# Patient Record
Sex: Male | Born: 1955 | Race: Black or African American | Hispanic: No | Smoking: Never smoker
Health system: Southern US, Community
[De-identification: ages and names within clinical notes are randomized; demographics above are authoritative.]

## PROBLEM LIST (undated history)

## (undated) DIAGNOSIS — E785 Hyperlipidemia, unspecified: Secondary | ICD-10-CM

## (undated) DIAGNOSIS — I10 Essential (primary) hypertension: Secondary | ICD-10-CM

## (undated) HISTORY — DX: Hyperlipidemia, unspecified: E78.5

## (undated) HISTORY — DX: Essential (primary) hypertension: I10

---

## 2003-03-09 ENCOUNTER — Emergency Department (HOSPITAL_COMMUNITY): Admission: EM | Admit: 2003-03-09 | Discharge: 2003-03-09 | Payer: Self-pay | Admitting: Emergency Medicine

## 2003-06-18 ENCOUNTER — Encounter: Payer: Self-pay | Admitting: Internal Medicine

## 2005-04-19 ENCOUNTER — Ambulatory Visit: Payer: Self-pay | Admitting: Internal Medicine

## 2005-04-25 ENCOUNTER — Ambulatory Visit: Payer: Self-pay | Admitting: Internal Medicine

## 2005-06-16 ENCOUNTER — Ambulatory Visit: Payer: Self-pay | Admitting: Internal Medicine

## 2006-08-31 ENCOUNTER — Ambulatory Visit: Payer: Self-pay | Admitting: Internal Medicine

## 2006-08-31 DIAGNOSIS — R259 Unspecified abnormal involuntary movements: Secondary | ICD-10-CM | POA: Insufficient documentation

## 2006-10-10 ENCOUNTER — Ambulatory Visit: Payer: Self-pay | Admitting: Internal Medicine

## 2006-10-10 LAB — CONVERTED CEMR LAB
ALT: 40 units/L (ref 0–53)
AST: 22 units/L (ref 0–37)
Albumin: 4.2 g/dL (ref 3.5–5.2)
Alkaline Phosphatase: 94 units/L (ref 39–117)
BUN: 14 mg/dL (ref 6–23)
Basophils Absolute: 0 10*3/uL (ref 0.0–0.1)
Basophils Relative: 0.7 % (ref 0.0–1.0)
Bilirubin Urine: NEGATIVE
Bilirubin, Direct: 0.1 mg/dL (ref 0.0–0.3)
Blood in Urine, dipstick: NEGATIVE
CO2: 29 meq/L (ref 19–32)
Calcium: 9.6 mg/dL (ref 8.4–10.5)
Chloride: 109 meq/L (ref 96–112)
Cholesterol: 212 mg/dL (ref 0–200)
Creatinine, Ser: 1.3 mg/dL (ref 0.4–1.5)
Direct LDL: 152.2 mg/dL
Eosinophils Absolute: 0.2 10*3/uL (ref 0.0–0.6)
Eosinophils Relative: 3.3 % (ref 0.0–5.0)
GFR calc Af Amer: 75 mL/min
GFR calc non Af Amer: 62 mL/min
Glucose, Bld: 91 mg/dL (ref 70–99)
Glucose, Urine, Semiquant: NEGATIVE
HCT: 43.5 % (ref 39.0–52.0)
HDL: 44.7 mg/dL (ref >39.0)
Hemoglobin: 14.8 g/dL (ref 13.0–17.0)
Ketones, urine, test strip: NEGATIVE
Lymphocytes Relative: 33.1 % (ref 12.0–46.0)
MCHC: 33.9 g/dL (ref 30.0–36.0)
MCV: 89 fL (ref 78.0–100.0)
Monocytes Absolute: 0.5 10*3/uL (ref 0.2–0.7)
Monocytes Relative: 7.9 % (ref 3.0–11.0)
Neutro Abs: 3.5 10*3/uL (ref 1.4–7.7)
Neutrophils Relative %: 55 % (ref 43.0–77.0)
Nitrite: NEGATIVE
PSA: 0.72 ng/mL (ref 0.10–4.00)
Platelets: 310 10*3/uL (ref 150–400)
Potassium: 5.3 meq/L — ABNORMAL HIGH (ref 3.5–5.1)
Protein, U semiquant: NEGATIVE
RBC: 4.89 M/uL (ref 4.22–5.81)
RDW: 12.6 % (ref 11.5–14.6)
Sodium: 143 meq/L (ref 135–145)
Specific Gravity, Urine: 1.025
TSH: 2.39 microintl units/mL (ref 0.35–5.50)
Total Bilirubin: 0.7 mg/dL (ref 0.3–1.2)
Total CHOL/HDL Ratio: 4.7
Total Protein: 7.3 g/dL (ref 6.0–8.3)
Triglycerides: 109 mg/dL (ref 0–149)
Urobilinogen, UA: 0.2
VLDL: 22 mg/dL (ref 0–40)
WBC Urine, dipstick: NEGATIVE
WBC: 6.3 10*3/uL (ref 4.5–10.5)
pH: 5

## 2006-10-15 ENCOUNTER — Ambulatory Visit: Payer: Self-pay | Admitting: Internal Medicine

## 2006-10-15 DIAGNOSIS — F528 Other sexual dysfunction not due to a substance or known physiological condition: Secondary | ICD-10-CM

## 2006-10-15 DIAGNOSIS — E782 Mixed hyperlipidemia: Secondary | ICD-10-CM

## 2006-10-17 LAB — CONVERTED CEMR LAB
FSH: 5.6 milliintl units/mL
LH: 2.2 milliintl units/mL
Testosterone: 230 ng/dL — ABNORMAL LOW (ref 350.00–890)

## 2006-10-19 ENCOUNTER — Ambulatory Visit: Payer: Self-pay | Admitting: Internal Medicine

## 2006-10-19 DIAGNOSIS — E291 Testicular hypofunction: Secondary | ICD-10-CM

## 2008-02-13 ENCOUNTER — Ambulatory Visit: Payer: Self-pay | Admitting: Internal Medicine

## 2008-02-13 LAB — CONVERTED CEMR LAB
Albumin: 4.7 g/dL (ref 3.5–5.2)
Alkaline Phosphatase: 87 units/L (ref 39–117)
BUN: 14 mg/dL (ref 6–23)
Basophils Absolute: 0 10*3/uL (ref 0.0–0.1)
Bilirubin Urine: NEGATIVE
Cholesterol: 243 mg/dL (ref 0–200)
Direct LDL: 183.1 mg/dL
Eosinophils Absolute: 0.1 10*3/uL (ref 0.0–0.7)
Eosinophils Relative: 0.9 % (ref 0.0–5.0)
GFR calc Af Amer: 75 mL/min
GFR calc non Af Amer: 62 mL/min
Glucose, Urine, Semiquant: NEGATIVE
HCT: 47 % (ref 39.0–52.0)
Ketones, urine, test strip: NEGATIVE
MCHC: 34.1 g/dL (ref 30.0–36.0)
MCV: 90.8 fL (ref 78.0–100.0)
Monocytes Absolute: 0.4 10*3/uL (ref 0.1–1.0)
Platelets: 323 10*3/uL (ref 150–400)
Potassium: 4.5 meq/L (ref 3.5–5.1)
RDW: 12.3 % (ref 11.5–14.6)
Specific Gravity, Urine: 1.025
TSH: 2.01 microintl units/mL (ref 0.35–5.50)
Total Bilirubin: 1 mg/dL (ref 0.3–1.2)
Triglycerides: 100 mg/dL (ref 0–149)
WBC: 6 10*3/uL (ref 4.5–10.5)
pH: 5.5

## 2008-02-19 ENCOUNTER — Ambulatory Visit: Payer: Self-pay | Admitting: Internal Medicine

## 2008-03-03 ENCOUNTER — Ambulatory Visit: Payer: Self-pay | Admitting: Internal Medicine

## 2008-04-02 ENCOUNTER — Ambulatory Visit: Payer: Self-pay | Admitting: Internal Medicine

## 2008-12-24 ENCOUNTER — Telehealth: Payer: Self-pay | Admitting: Internal Medicine

## 2009-02-16 ENCOUNTER — Ambulatory Visit: Payer: Self-pay | Admitting: Internal Medicine

## 2009-02-16 DIAGNOSIS — R319 Hematuria, unspecified: Secondary | ICD-10-CM

## 2009-02-16 LAB — CONVERTED CEMR LAB
Glucose, Urine, Semiquant: NEGATIVE
Specific Gravity, Urine: 1.02
WBC Urine, dipstick: NEGATIVE
pH: 5

## 2009-02-19 ENCOUNTER — Ambulatory Visit: Payer: Self-pay | Admitting: Internal Medicine

## 2009-02-20 ENCOUNTER — Encounter: Payer: Self-pay | Admitting: Internal Medicine

## 2009-02-24 ENCOUNTER — Telehealth: Payer: Self-pay | Admitting: Internal Medicine

## 2009-02-26 ENCOUNTER — Telehealth: Payer: Self-pay | Admitting: Internal Medicine

## 2009-03-03 ENCOUNTER — Telehealth: Payer: Self-pay | Admitting: Internal Medicine

## 2010-01-07 ENCOUNTER — Ambulatory Visit
Admission: RE | Admit: 2010-01-07 | Discharge: 2010-01-07 | Payer: Self-pay | Source: Home / Self Care | Attending: Internal Medicine | Admitting: Internal Medicine

## 2010-01-07 DIAGNOSIS — R109 Unspecified abdominal pain: Secondary | ICD-10-CM | POA: Insufficient documentation

## 2010-01-24 ENCOUNTER — Telehealth: Payer: Self-pay | Admitting: Internal Medicine

## 2010-02-01 NOTE — Progress Notes (Signed)
Summary: CT  Phone Note Call from Patient   Caller: Patient Call For: Birdie Sons MD Summary of Call: his CT is scheduled for 3/14. Initial call taken by: Raechel Ache, RN,  March 03, 2009 9:33 AM

## 2010-02-01 NOTE — Progress Notes (Signed)
Summary: hematuria  Phone Note Call from Patient   Caller: Patient Call For: Birdie Sons MD Summary of Call: [phone] Pt wants to know if he should notifiy [place] of anymore hematuria or what to do if it happens again?  Initial call taken by: Lynann Beaver CMA,  February 26, 2009 8:47 AM  Follow-up for Phone Call        urine culture is negative.  I would like to further investigate hematuria.  Schedule CT scan of abdomen and pelvis for hematuria.  This is with contrast. Follow-up by: Birdie Sons MD,  March 01, 2009 6:57 AM  Additional Follow-up for Phone Call Additional follow up Details #1::        Spoke to pt about scheduling the CT of abdomen and  pelvis.   Informed us that he is now having some blood in his semen.  Dr. Cato Mulligan notified, and will proceed with CT of abdomen and pelvis. Additional Follow-up by: Lynann Beaver CMA,  March 01, 2009 9:20 AM    +

## 2010-02-01 NOTE — Assessment & Plan Note (Signed)
Summary: uti, hematuria/dm   Vital Signs:  Patient profile:   55 year old male Temp:     98.2 degrees F Pulse rate:   84 / minute Resp:     12 per minute BP sitting:   118 / 72  (left arm)  Vitals Entered By: Gladis Riffle, RN (February 16, 2009 10:53 AM) CC: c/o hematuria since last night Is Patient Diabetic? No   CC:  c/o hematuria since last night.  History of Present Illness: painless hematuria noted last night--bright red blood no pain noted brown urine this a.m. small blood clot this a.m. No sxs since then no hx of kidney stone no dysuria  All other systems reviewed and were negative   Preventive Screening-Counseling & Management  Alcohol-Tobacco     Smoking Status: never  Medications Prior to Update: 1)  No Medications  Allergies (verified): No Known Drug Allergies  Physical Exam  General:  Well-developed,well-nourished,in no acute distress; alert,appropriate and cooperative throughout examination Head:  normocephalic and atraumatic.   Eyes:  pupils equal and pupils round.   Ears:  R ear normal and L ear normal.   Neck:  No deformities, masses, or tenderness noted. Chest Wall:  No deformities, masses, tenderness or gynecomastia noted. Lungs:  Normal respiratory effort, chest expands symmetrically. Lungs are clear to auscultation, no crackles or wheezes. Abdomen:  Bowel sounds positive,abdomen soft and non-tender without masses, organomegaly or hernias noted. Msk:  No deformity or scoliosis noted of thoracic or lumbar spine.     Impression & Recommendations:  Problem # 1:  HEMATURIA (ICD-599.70) ? cause U/A i am going to write vicodin just in case he develops renal colic  may need CT may need urology discussed with pt  Complete Medication List: 1)  No Medications  2)  Hydrocodone-acetaminophen 5-325 Mg Tabs (Hydrocodone-acetaminophen) .Marland Kitchen.. 1 by mouth up to 4 times per day as needed for pain  Other Orders: UA Dipstick w/o Micro (automated)   (81003) Prescriptions: HYDROCODONE-ACETAMINOPHEN 5-325 MG TABS (HYDROCODONE-ACETAMINOPHEN) 1 by mouth up to 4 times per day as needed for pain  #20 x 0   Entered and Authorized by:   Birdie Sons MD   Signed by:   Birdie Sons MD on 02/16/2009   Method used:   Print then Give to Patient   RxID:   [id number]   Laboratory Results   Urine Tests    Routine Urinalysis   Color: yellow Appearance: Clear Glucose: negative   (Normal Range: Negative) Bilirubin: negative   (Normal Range: Negative) Ketone: negative   (Normal Range: Negative) Spec. Gravity: 1.020   (Normal Range: 1.003-1.035) Blood: 2+   (Normal Range: Negative) pH: 5.0   (Normal Range: 5.0-8.0) Protein: negative   (Normal Range: Negative) Urobilinogen: 0.2   (Normal Range: 0-1) Nitrite: negative   (Normal Range: Negative) Leukocyte Esterace: negative   (Normal Range: Negative)    Comments: Rita Ohara  February 16, 2009 11:40 AM     Appended Document: uti, hematuria/dm call patient. my mistake urine culture was not done. can he give urine at any [place] office? send for culture---hematuria  Appended Document: uti, hematuria/dm Patient notified. appt made for culture 2/18.

## 2010-02-01 NOTE — Progress Notes (Signed)
Summary: test results  Phone Note Call from Patient   Caller: Patient Call For: Birdie Sons MD Summary of Call: called about lab results Initial call taken by: Raechel Ache, RN,  February 24, 2009 1:45 PM  Follow-up for Phone Call        advised urine cx negative. He is doing ok. Follow-up by: Raechel Ache, RN,  February 24, 2009 1:45 PM

## 2010-02-03 NOTE — Assessment & Plan Note (Signed)
Summary: abdominal pain/dm   Vital Signs:  Patient profile:   55 year old male Weight:      183 pounds BMI:     27.93 Temp:     98.6 degrees F oral Pulse rate:   108 / minute Pulse rhythm:   regular BP sitting:   124 / 90  (left arm) Cuff size:   regular  Vitals Entered By: Alfred Levins, CMA (January 07, 2010 11:26 AM) CC: LLQ pain x1 1/2 wks   CC:  LLQ pain x1 1/2 wks.  History of Present Illness: 4-5 days ago had significant LLQ pain. thought he was constipated---tried OTC laxatives---"cleaned me out". Sxs came back with severe pain.  Hx of kidney stones---"feels different"  Current Medications (verified): 1)  No Medications  Allergies (verified): No Known Drug Allergies  Physical Exam  General:   well-developed well-nourished male in no acute distress. HEENT exam atraumatic, or [place] muscles are intact. No icterus. Neck is supple. Chest clear to auscultation cardiac exam S1-S2 are regular abdominal exam bowel sounds, soft, nondistended. He has tenderness to palpation in the left lower quadrant no  rebound or guarding tenderness.   Impression & Recommendations:  Problem # 1:  ABDOMINAL PAIN (ICD-789.00)   the patient has diverticulitis. Discussed with him. We'll try antibiotics. He'll call if he gets fever or worsening symptoms. He does need colonoscopy. I will reschedule that. side effects of antibiotics discussed.  Orders: UA Dipstick w/o Micro (automated)  (81003)  Complete Medication List: 1)  Cipro 500 Mg Tabs (Ciprofloxacin hcl) .Marland Kitchen.. 1 by mouth 2 times daily 2)  Metronidazole 500 Mg Tabs (Metronidazole) .... Take 1 tablet by mouth three times a day  Patient Instructions: 1)  . Prescriptions: METRONIDAZOLE 500 MG  TABS (METRONIDAZOLE) Take 1 tablet by mouth three times a day  #15 x 0   Entered and Authorized by:   Birdie Sons MD   Signed by:   Birdie Sons MD on 01/07/2010   Method used:   Electronically to        [facility] [phone]*  (retail)       [address] [place], [place]  [address]       Ph: [phone]       Fax: [phone]   RxID:   [id number] CIPRO 500 MG TABS (CIPROFLOXACIN HCL) 1 by mouth 2 times daily  #14 x 0   Entered and Authorized by:   Birdie Sons MD   Signed by:   Birdie Sons MD on 01/07/2010   Method used:   Electronically to        [facility] [phone]* (retail)       [address] [place], [place]  [address]       Ph: [phone]       Fax: [phone]   RxID:   [id number]    Orders Added: 1)  Est. Patient Level IV [35573] 2)  UA Dipstick w/o Micro (automated)  [81003]  Appended Document: abdominal pain/dm    Nurse Visit   Allergies: No Known Drug Allergies Laboratory Results   Urine Tests  Date/Time Received: January 07, 2010 11:54 AM  Routine Urinalysis   Color: orange Appearance: Clear Glucose: negative   (Normal Range: Negative) Bilirubin: small   (Normal Range: Negative) Ketone: trace (5)   (Normal Range: Negative) Spec. Gravity: 1.025   (Normal Range: 1.003-1.035) Blood:  large   (Normal Range: Negative) pH: 5.5   (Normal Range: 5.0-8.0) Protein: 100   (Normal Range: Negative) Urobilinogen: 0.2   (Normal Range: 0-1) Nitrite: positive   (Normal Range: Negative) Leukocyte Esterace: negative   (Normal Range: Negative)    Comments: Alfred Levins, CMA  January 07, 2010 11:57 AM

## 2010-02-03 NOTE — Progress Notes (Signed)
  Phone Note Call from Patient   Caller: Patient Call For: Birdie Sons MD Summary of Call: Pt still has a small amount of pain before a bowel movement.  Is that normal after a bout of diverticulitis? Initial call taken by: [facility] CMA AAMA,  January 24, 2010 10:26 AM  Follow-up for Phone Call         it's possible that should resolve over a 3 to four-week period of time. If symptoms persist he may need a CT scan of the abdomen and pelvis. Follow-up by: Birdie Sons MD,  January 24, 2010 4:37 PM  Additional Follow-up for Phone Call Additional follow up Details #1::        Pt. notified. Additional Follow-up by: Lynann Beaver CMA AAMA,  January 25, 2010 8:05 AM

## 2011-07-18 ENCOUNTER — Other Ambulatory Visit (INDEPENDENT_AMBULATORY_CARE_PROVIDER_SITE_OTHER): Payer: Managed Care, Other (non HMO)

## 2011-07-18 DIAGNOSIS — Z0000 Encounter for general adult medical examination without abnormal findings: Secondary | ICD-10-CM

## 2011-07-18 LAB — LIPID PANEL: HDL: 50.4 mg/dL (ref >39.00)

## 2011-07-18 LAB — HEPATIC FUNCTION PANEL
ALT: 28 U/L (ref 0–53)
Alkaline Phosphatase: 92 U/L (ref 39–117)
Bilirubin, Direct: 0.1 mg/dL (ref 0.0–0.3)
Total Protein: 7.8 g/dL (ref 6.0–8.3)

## 2011-07-18 LAB — CBC WITH DIFFERENTIAL/PLATELET
Basophils Relative: 0.6 % (ref 0.0–3.0)
Eosinophils Absolute: 0.1 10*3/uL (ref 0.0–0.7)
Eosinophils Relative: 0.8 % (ref 0.0–5.0)
Lymphocytes Relative: 28.9 % (ref 12.0–46.0)
Neutrophils Relative %: 62.6 % (ref 43.0–77.0)
RBC: 4.98 Mil/uL (ref 4.22–5.81)
WBC: 6.8 10*3/uL (ref 4.5–10.5)

## 2011-07-18 LAB — POCT URINALYSIS DIPSTICK
Ketones, UA: NEGATIVE
Leukocytes, UA: NEGATIVE
Nitrite, UA: NEGATIVE

## 2011-07-18 LAB — BASIC METABOLIC PANEL
Calcium: 9.4 mg/dL (ref 8.4–10.5)
Creatinine, Ser: 1.2 mg/dL (ref 0.4–1.5)
GFR: 81.01 mL/min (ref >60.00)

## 2011-07-18 LAB — PSA: PSA: 0.8 ng/mL (ref 0.10–4.00)

## 2011-07-25 ENCOUNTER — Encounter: Payer: Self-pay | Admitting: Internal Medicine

## 2011-07-25 ENCOUNTER — Ambulatory Visit (INDEPENDENT_AMBULATORY_CARE_PROVIDER_SITE_OTHER): Payer: Managed Care, Other (non HMO) | Admitting: Internal Medicine

## 2011-07-25 VITALS — BP 144/94 | HR 76 | Temp 98.3°F | Ht 68.0 in | Wt 208.0 lb

## 2011-07-25 DIAGNOSIS — Z0000 Encounter for general adult medical examination without abnormal findings: Secondary | ICD-10-CM

## 2011-07-25 NOTE — Progress Notes (Signed)
Patient ID: Shane Estes, male   DOB: 1955/12/08, 56 y.o.   MRN: [id number] CPX  Past Medical History  Diagnosis Date  . Hyperlipidemia     History   Social History  . Marital Status: Married    Spouse Name: N/A    Number of Children: N/A  . Years of Education: N/A   Occupational History  . Not on file.   Social History Main Topics  . Smoking status: Never Smoker   . Smokeless tobacco: Not on file  . Alcohol Use: 6.0 oz/week    10 Glasses of wine per week  . Drug Use: No  . Sexually Active: Not on file   Other Topics Concern  . Not on file   Social History Narrative   Divorced 2011    History reviewed. No pertinent past surgical history.  Family History  Problem Relation Age of Onset  . Heart attack Mother   . Heart failure Mother   . Cancer Father     bone    No Known Allergies  No current outpatient prescriptions on file prior to visit.     patient denies chest pain, shortness of breath, orthopnea. Denies lower extremity edema, abdominal pain, change in appetite, change in bowel movements. Patient denies rashes, musculoskeletal complaints. No other specific complaints in a complete review of systems.   BP 150/104  Pulse 76  Temp 98.3 F (36.8 C) (Oral)  Ht 5\' 8"  (1.727 m)  Wt 208 lb (94.348 kg)  BMI 31.63 kg/m2 Well-developed male in no acute distress. HEENT exam atraumatic, normocephalic, extraocular muscles are intact. Conjunctivae are pink without exudate. Neck is supple without lymphadenopathy, thyromegaly, jugular venous distention. Chest is clear to auscultation without increased work of breathing. Cardiac exam S1-S2 are regular. The PMI is normal. No significant murmurs or gallops. Abdominal exam active bowel sounds, soft, nontender. No abdominal bruits. Extremities no clubbing cyanosis or edema. Peripheral pulses are normal without bruits. Neurologic exam alert and oriented without any motor or sensory deficits.   Well visit: health maint UTD

## 2011-07-26 ENCOUNTER — Encounter: Payer: Self-pay | Admitting: Internal Medicine

## 2011-08-02 ENCOUNTER — Ambulatory Visit (AMBULATORY_SURGERY_CENTER): Payer: Managed Care, Other (non HMO)

## 2011-08-02 VITALS — Ht 68.0 in | Wt 201.0 lb

## 2011-08-02 DIAGNOSIS — Z1211 Encounter for screening for malignant neoplasm of colon: Secondary | ICD-10-CM

## 2011-08-02 MED ORDER — MOVIPREP 100 G PO SOLR: 1.0000 | Freq: Once | ORAL | Status: DC

## 2011-08-16 ENCOUNTER — Ambulatory Visit (AMBULATORY_SURGERY_CENTER): Payer: Managed Care, Other (non HMO) | Admitting: Internal Medicine

## 2011-08-16 ENCOUNTER — Encounter: Payer: Self-pay | Admitting: Internal Medicine

## 2011-08-16 VITALS — BP 153/99 | HR 110 | Temp 97.1°F | Resp 20 | Ht 68.0 in | Wt 201.0 lb

## 2011-08-16 DIAGNOSIS — Z1211 Encounter for screening for malignant neoplasm of colon: Secondary | ICD-10-CM

## 2011-08-16 DIAGNOSIS — D126 Benign neoplasm of colon, unspecified: Secondary | ICD-10-CM

## 2011-08-16 MED ORDER — SODIUM CHLORIDE 0.9 % IV SOLN: 500.0000 mL | INTRAVENOUS | Status: DC

## 2011-08-16 NOTE — Op Note (Signed)
[facility] [address] [place], [place]  [address]  COLONOSCOPY PROCEDURE REPORT  PATIENT:  Shane, Estes  MR#:  [id number] BIRTHDATE:  1955/10/24, 56 yrs. old  GENDER:  male ENDOSCOPIST:  Wilhemina Bonito. Eda Keys, MD REF. BY:  Birdie Sons, M.D. PROCEDURE DATE:  08/16/2011 PROCEDURE:  Colonoscopy with snare polypectomy x 2 ASA CLASS:  Class I INDICATIONS:  Routine Risk Screening MEDICATIONS:   MAC sedation, administered by CRNA, propofol (Diprivan) 320 mg IV  DESCRIPTION OF PROCEDURE:   After the risks benefits and alternatives of the procedure were thoroughly explained, informed consent was obtained.  Digital rectal exam was performed and revealed no abnormalities.   The LB [id number] endoscope was introduced through the anus and advanced to the cecum, which was identified by both the appendix and ileocecal valve, without limitations.  The quality of the prep was excellent, using MoviPrep.  The instrument was then slowly withdrawn as the colon was fully examined. <<PROCEDUREIMAGES>>  FINDINGS:  Two polyps were found in the ascending (2mm) and transverse (3mm) colon. Polyps were snared without cautery. Retrieval was successful.   Moderate diverticulosis was found throughout the colon.   Retroflexed views in the rectum revealed no abnormalities.    The time to cecum =   2:31  minutes. The scope was then withdrawn in   13:17  minutes from the cecum and the procedure completed.  COMPLICATIONS:  None  ENDOSCOPIC IMPRESSION: 1) Two small polyps in the  colon - removed 2) Moderate diverticulosis throughout the colon  RECOMMENDATIONS: 1) Repeat colonoscopy in 5 years if polyp adenomatous; otherwise 10 years  ______________________________ Wilhemina Bonito. Eda Keys, MD  CC:  Lindley Magnus, MD;  The Patient  n. eSIGNED:   Wilhemina Bonito. Eda Keys at 08/16/2011 11:10 AM  Ted Mcalpine, [phone]

## 2011-08-16 NOTE — Progress Notes (Signed)
Patient did not experience any of the following events: a burn prior to discharge; a fall within the facility; wrong site/side/patient/procedure/implant event; or a hospital transfer or hospital admission upon discharge from the facility. (G8907) Patient did not have preoperative order for IV antibiotic SSI prophylaxis. (G8918)  

## 2011-08-16 NOTE — Patient Instructions (Signed)
YOU HAD AN ENDOSCOPIC PROCEDURE TODAY AT THE [facility]: Refer to the procedure report that was given to you for any specific questions about what was found during the examination.  If the procedure report does not answer your questions, please call your gastroenterologist to clarify.  If you requested that your care partner not be given the details of your procedure findings, then the procedure report has been included in a sealed envelope for you to review at your convenience later.  YOU SHOULD EXPECT: Some feelings of bloating in the abdomen. Passage of more gas than usual.  Walking can help get rid of the air that was put into your GI tract during the procedure and reduce the bloating. If you had a lower endoscopy (such as a colonoscopy or flexible sigmoidoscopy) you may notice spotting of blood in your stool or on the toilet paper. If you underwent a bowel prep for your procedure, then you may not have a normal bowel movement for a few days.  DIET: Your first meal following the procedure should be a light meal and then it is ok to progress to your normal diet.  A half-sandwich or bowl of soup is an example of a good first meal.  Heavy or fried foods are harder to digest and may make you feel nauseous or bloated.  Likewise meals heavy in dairy and vegetables can cause extra gas to form and this can also increase the bloating.  Drink plenty of fluids but you should avoid alcoholic beverages for 24 hours.  ACTIVITY: Your care partner should take you home directly after the procedure.  You should plan to take it easy, moving slowly for the rest of the day.  You can resume normal activity the day after the procedure however you should NOT DRIVE or use heavy machinery for 24 hours (because of the sedation medicines used during the test).    SYMPTOMS TO REPORT IMMEDIATELY: A gastroenterologist can be reached at any hour.  During normal business hours, 8:30 AM to 5:00 PM Monday through Friday,  call [phone].  After hours and on weekends, please call the GI answering service at [phone] who will take a message and have the physician on call contact you.   Following lower endoscopy (colonoscopy or flexible sigmoidoscopy):  Excessive amounts of blood in the stool  Significant tenderness or worsening of abdominal pains  Swelling of the abdomen that is new, acute  Fever of 100F or higher    FOLLOW UP: If any biopsies were taken you will be contacted by phone or by letter within the next 1-3 weeks.  Call your gastroenterologist if you have not heard about the biopsies in 3 weeks.  Our staff will call the home number listed on your records the next business day following your procedure to check on you and address any questions or concerns that you may have at that time regarding the information given to you following your procedure. This is a courtesy call and so if there is no answer at the home number and we have not heard from you through the emergency physician on call, we will assume that you have returned to your regular daily activities without incident.  SIGNATURES/CONFIDENTIALITY: You and/or your care partner have signed paperwork which will be entered into your electronic medical record.  These signatures attest to the fact that that the information above on your After Visit Summary has been reviewed and is understood.  Full responsibility of the confidentiality   of this discharge information lies with you and/or your care-partner.     INFORMATION ON POLYPS, DIVERTICULOSIS, & HIGH FIBER DIET GIVEN TO YOU TODAY 

## 2011-08-17 ENCOUNTER — Telehealth: Payer: Self-pay

## 2011-08-17 NOTE — Telephone Encounter (Signed)
I left a message on the pt's answering machine [phone] for the pt to call if any questions or concerns. maw

## 2011-08-22 ENCOUNTER — Encounter: Payer: Self-pay | Admitting: Internal Medicine

## 2011-10-25 ENCOUNTER — Ambulatory Visit (INDEPENDENT_AMBULATORY_CARE_PROVIDER_SITE_OTHER): Payer: Managed Care, Other (non HMO) | Admitting: Internal Medicine

## 2011-10-25 ENCOUNTER — Encounter: Payer: Self-pay | Admitting: Internal Medicine

## 2011-10-25 VITALS — BP 156/94 | HR 76 | Temp 98.0°F | Wt 208.0 lb

## 2011-10-25 DIAGNOSIS — I10 Essential (primary) hypertension: Secondary | ICD-10-CM

## 2011-10-25 MED ORDER — LISINOPRIL 20 MG PO TABS: 20.0000 mg | ORAL_TABLET | Freq: Every day | ORAL | Status: DC

## 2011-10-25 NOTE — Patient Instructions (Signed)
Goal blood pressure less than 135/85

## 2011-10-25 NOTE — Progress Notes (Signed)
Patient ID: Shane Estes, male   DOB: 09-19-1955, 56 y.o.   MRN: [id number] Here for bp check- he has bp measured at work-"it's not perfect, but nothing to be alarmed about"- he can't remember any numbers.  Currently on no meds for bp, lipids  He has started an exercise program  Past Medical History  Diagnosis Date  . Hyperlipidemia     History   Social History  . Marital Status: Married    Spouse Name: N/A    Number of Children: N/A  . Years of Education: N/A   Occupational History  . Not on file.   Social History Main Topics  . Smoking status: Never Smoker   . Smokeless tobacco: Never Used  . Alcohol Use: 6.0 oz/week    10 Glasses of wine per week  . Drug Use: No  . Sexually Active: Not on file   Other Topics Concern  . Not on file   Social History Narrative   Divorced 2011    No past surgical history on file.  Family History  Problem Relation Age of Onset  . Heart attack Mother   . Heart failure Mother   . Cancer Father     bone  . Colon cancer Neg Hx   . Rectal cancer Neg Hx   . Stomach cancer Neg Hx     No Known Allergies  No current outpatient prescriptions on file prior to visit.     patient denies chest pain, shortness of breath, orthopnea. Denies lower extremity edema, abdominal pain, change in appetite, change in bowel movements. Patient denies rashes, musculoskeletal complaints. No other specific complaints in a complete review of systems.   BP 156/94  Pulse 76  Temp 98 F (36.7 C) (Oral)  Wt 208 lb (94.348 kg)  well-developed well-nourished male in no acute distress. HEENT exam atraumatic, normocephalic, neck supple without jugular venous distention. Chest clear to auscultation cardiac exam S1-S2 are regular. Abdominal exam overweight with bowel sounds, soft and nontender. Extremities no edema. Neurologic exam is alert with a normal gait.

## 2011-10-25 NOTE — Assessment & Plan Note (Signed)
Reviewed previous bps- note weight gain past 2 years Start lisinopril Side efects discussed Check bp in 1 months  Discussed other risk factors for vascular disease-- lipids, age

## 2011-11-29 ENCOUNTER — Encounter: Payer: Managed Care, Other (non HMO) | Admitting: Internal Medicine

## 2011-12-11 ENCOUNTER — Encounter: Payer: Managed Care, Other (non HMO) | Admitting: Internal Medicine

## 2012-02-26 ENCOUNTER — Encounter: Payer: Managed Care, Other (non HMO) | Admitting: Internal Medicine

## 2012-02-26 NOTE — Progress Notes (Signed)
Patient ID: Shane Estes, male   DOB: [birth year], 57 y.o.   MRN: [id number] Pt is here for f/u of htn, lipids, testosterone deficiency. Tolerating meds (lisinopril)  Past Medical History  Diagnosis Date  . Hyperlipidemia     History   Social History  . Marital Status: Divorced    Spouse Name: N/A    Number of Children: N/A  . Years of Education: N/A   Occupational History  . Not on file.   Social History Main Topics  . Smoking status: Never Smoker   . Smokeless tobacco: Never Used  . Alcohol Use: 6.0 oz/week    10 Glasses of wine per week  . Drug Use: No  . Sexually Active: Not on file   Other Topics Concern  . Not on file   Social History Narrative   Divorced 2011    No past surgical history on file.  Family History  Problem Relation Age of Onset  . Heart attack Mother   . Heart failure Mother   . Cancer Father     bone  . Colon cancer Neg Hx   . Rectal cancer Neg Hx   . Stomach cancer Neg Hx     No Known Allergies  Current Outpatient Prescriptions on File Prior to Visit  Medication Sig Dispense Refill  . lisinopril (PRINIVIL,ZESTRIL) 20 MG tablet Take 1 tablet (20 mg total) by mouth daily.  90 tablet  3   No current facility-administered medications on file prior to visit.     patient denies chest pain, shortness of breath, orthopnea. Denies lower extremity edema, abdominal pain, change in appetite, change in bowel movements. Patient denies rashes, musculoskeletal complaints. No other specific complaints in a complete review of systems.   There were no vitals taken for this visit.  well-developed well-nourished male in no acute distress. HEENT exam atraumatic, normocephalic, neck supple without jugular venous distention. Chest clear to auscultation cardiac exam S1-S2 are regular. Abdominal exam overweight with bowel sounds, soft and nontender. Extremities no edema. Neurologic exam is alert with a normal gait.  This encounter was created in error -  please disregard.

## 2012-11-07 ENCOUNTER — Other Ambulatory Visit: Payer: Self-pay

## 2013-01-15 ENCOUNTER — Other Ambulatory Visit (INDEPENDENT_AMBULATORY_CARE_PROVIDER_SITE_OTHER): Payer: Managed Care, Other (non HMO)

## 2013-01-15 ENCOUNTER — Other Ambulatory Visit: Payer: Managed Care, Other (non HMO)

## 2013-01-15 DIAGNOSIS — Z0000 Encounter for general adult medical examination without abnormal findings: Secondary | ICD-10-CM

## 2013-01-15 LAB — POCT URINALYSIS DIPSTICK
BILIRUBIN UA: NEGATIVE
Glucose, UA: NEGATIVE
Ketones, UA: NEGATIVE
Leukocytes, UA: NEGATIVE
NITRITE UA: NEGATIVE
PH UA: 6
PROTEIN UA: NEGATIVE
Spec Grav, UA: <=1.005
Urobilinogen, UA: 0.2

## 2013-01-15 LAB — CBC WITH DIFFERENTIAL/PLATELET
BASOS PCT: 0.5 % (ref 0.0–3.0)
Basophils Absolute: 0 10*3/uL (ref 0.0–0.1)
EOS PCT: 1.8 % (ref 0.0–5.0)
Eosinophils Absolute: 0.1 10*3/uL (ref 0.0–0.7)
HEMATOCRIT: 46.4 % (ref 39.0–52.0)
Hemoglobin: 15.8 g/dL (ref 13.0–17.0)
LYMPHS ABS: 2.1 10*3/uL (ref 0.7–4.0)
Lymphocytes Relative: 31.2 % (ref 12.0–46.0)
MCHC: 34.1 g/dL (ref 30.0–36.0)
MCV: 87.8 fl (ref 78.0–100.0)
MONO ABS: 0.5 10*3/uL (ref 0.1–1.0)
Monocytes Relative: 8 % (ref 3.0–12.0)
NEUTROS ABS: 3.8 10*3/uL (ref 1.4–7.7)
Neutrophils Relative %: 58.5 % (ref 43.0–77.0)
Platelets: 329 10*3/uL (ref 150.0–400.0)
RBC: 5.28 Mil/uL (ref 4.22–5.81)
RDW: 13.6 % (ref 11.5–14.6)
WBC: 6.6 10*3/uL (ref 4.5–10.5)

## 2013-01-15 LAB — PSA: PSA: 0.87 ng/mL (ref 0.10–4.00)

## 2013-01-15 LAB — HEPATIC FUNCTION PANEL
ALBUMIN: 4.5 g/dL (ref 3.5–5.2)
ALT: 26 U/L (ref 0–53)
AST: 22 U/L (ref 0–37)
Alkaline Phosphatase: 98 U/L (ref 39–117)
BILIRUBIN DIRECT: 0.2 mg/dL (ref 0.0–0.3)
TOTAL PROTEIN: 8.4 g/dL — AB (ref 6.0–8.3)
Total Bilirubin: 0.9 mg/dL (ref 0.3–1.2)

## 2013-01-15 LAB — BASIC METABOLIC PANEL
BUN: 13 mg/dL (ref 6–23)
CHLORIDE: 100 meq/L (ref 96–112)
CO2: 29 meq/L (ref 19–32)
Calcium: 10.3 mg/dL (ref 8.4–10.5)
Creatinine, Ser: 1.3 mg/dL (ref 0.4–1.5)
GFR: 76.86 mL/min (ref >60.00)
Glucose, Bld: 96 mg/dL (ref 70–99)
POTASSIUM: 5.5 meq/L — AB (ref 3.5–5.1)
Sodium: 138 mEq/L (ref 135–145)

## 2013-01-15 LAB — LIPID PANEL
Cholesterol: 246 mg/dL — ABNORMAL HIGH (ref 0–200)
HDL: 51.3 mg/dL (ref >39.00)
Total CHOL/HDL Ratio: 5
Triglycerides: 233 mg/dL — ABNORMAL HIGH (ref 0.0–149.0)
VLDL: 46.6 mg/dL — ABNORMAL HIGH (ref 0.0–40.0)

## 2013-01-15 LAB — LDL CHOLESTEROL, DIRECT: LDL DIRECT: 179.8 mg/dL

## 2013-01-15 LAB — TSH: TSH: 3.66 u[IU]/mL (ref 0.35–5.50)

## 2013-01-20 ENCOUNTER — Encounter: Payer: Self-pay | Admitting: Internal Medicine

## 2013-01-20 ENCOUNTER — Ambulatory Visit (INDEPENDENT_AMBULATORY_CARE_PROVIDER_SITE_OTHER): Payer: Managed Care, Other (non HMO) | Admitting: Internal Medicine

## 2013-01-20 VITALS — BP 174/112 | HR 92 | Temp 97.7°F | Ht 67.75 in | Wt 212.0 lb

## 2013-01-20 DIAGNOSIS — I10 Essential (primary) hypertension: Secondary | ICD-10-CM

## 2013-01-20 DIAGNOSIS — E785 Hyperlipidemia, unspecified: Secondary | ICD-10-CM

## 2013-01-20 DIAGNOSIS — Z0000 Encounter for general adult medical examination without abnormal findings: Secondary | ICD-10-CM

## 2013-01-20 MED ORDER — LISINOPRIL-HYDROCHLOROTHIAZIDE 20-25 MG PO TABS: 1.0000 | ORAL_TABLET | Freq: Every day | ORAL | Status: DC

## 2013-01-20 NOTE — Assessment & Plan Note (Signed)
Discussed need for weight loss, daily exercise and low fat diet  Recheck labs in 3 months

## 2013-01-20 NOTE — Assessment & Plan Note (Signed)
Needs treatment- will alter previously plan (he was non compliant anyway)

## 2013-01-20 NOTE — Progress Notes (Signed)
cpx  Patient has not been compliant with his medications (lisinopril)  Past Medical History  Diagnosis Date  . Hyperlipidemia     History   Social History  . Marital Status: Divorced    Spouse Name: N/A    Number of Children: N/A  . Years of Education: N/A   Occupational History  . Not on file.   Social History Main Topics  . Smoking status: Never Smoker   . Smokeless tobacco: Never Used  . Alcohol Use: 6.0 oz/week    10 Glasses of wine per week  . Drug Use: No  . Sexual Activity: Not on file   Other Topics Concern  . Not on file   Social History Narrative   Divorced 2011    No past surgical history on file.  Family History  Problem Relation Age of Onset  . Heart attack Mother   . Heart failure Mother   . Cancer Father     bone  . Colon cancer Neg Hx   . Rectal cancer Neg Hx   . Stomach cancer Neg Hx     No Known Allergies  Current Outpatient Prescriptions on File Prior to Visit  Medication Sig Dispense Refill  . lisinopril (PRINIVIL,ZESTRIL) 20 MG tablet Take 1 tablet (20 mg total) by mouth daily.  90 tablet  3   No current facility-administered medications on file prior to visit.     patient denies chest pain, shortness of breath, orthopnea. Denies lower extremity edema, abdominal pain, change in appetite, change in bowel movements. Patient denies rashes, musculoskeletal complaints. No other specific complaints in a complete review of systems.   Reviewed vitals Well-developed male in no acute distress. HEENT exam atraumatic, normocephalic, extraocular muscles are intact. Conjunctivae are pink without exudate. Neck is supple without lymphadenopathy, thyromegaly, jugular venous distention. Chest is clear to auscultation without increased work of breathing. Cardiac exam S1-S2 are regular. The PMI is normal. No significant murmurs or gallops. Abdominal exam active bowel sounds, soft, nontender. No abdominal bruits. Extremities no clubbing cyanosis or edema.  Peripheral pulses are normal without bruits. Neurologic exam alert and oriented without any motor or sensory deficits.    Well visit- health maint utd Discussed need for weight loss- Goal bmi < 25

## 2013-01-20 NOTE — Progress Notes (Signed)
Pre visit review using our clinic review tool, if applicable. No additional management support is needed unless otherwise documented below in the visit note. 

## 2013-02-05 ENCOUNTER — Telehealth: Payer: Self-pay | Admitting: Internal Medicine

## 2013-02-05 NOTE — Telephone Encounter (Signed)
Relevant patient education assigned to patient using Emmi. ° °

## 2013-03-03 ENCOUNTER — Encounter: Payer: Self-pay | Admitting: Internal Medicine

## 2013-03-03 ENCOUNTER — Ambulatory Visit (INDEPENDENT_AMBULATORY_CARE_PROVIDER_SITE_OTHER): Payer: Managed Care, Other (non HMO) | Admitting: Internal Medicine

## 2013-03-03 VITALS — BP 128/90 | HR 84 | Temp 98.3°F | Ht 67.75 in | Wt 206.0 lb

## 2013-03-03 DIAGNOSIS — Z111 Encounter for screening for respiratory tuberculosis: Secondary | ICD-10-CM

## 2013-03-03 DIAGNOSIS — R319 Hematuria, unspecified: Secondary | ICD-10-CM

## 2013-03-03 DIAGNOSIS — E785 Hyperlipidemia, unspecified: Secondary | ICD-10-CM

## 2013-03-03 DIAGNOSIS — I10 Essential (primary) hypertension: Secondary | ICD-10-CM

## 2013-03-03 NOTE — Progress Notes (Signed)
Pre visit review using our clinic review tool, if applicable. No additional management support is needed unless otherwise documented below in the visit note. 

## 2013-03-03 NOTE — Progress Notes (Signed)
Patient comes in for followup of hypertension hyperlipidemia. Note previously elevated potassium. He is tolerating lisinopril/hydrochlorothiazide.  Last office visit we discussed weight loss.  Past Medical History  Diagnosis Date  . Hyperlipidemia     History   Social History  . Marital Status: Divorced    Spouse Name: N/A    Number of Children: N/A  . Years of Education: N/A   Occupational History  . Not on file.   Social History Main Topics  . Smoking status: Never Smoker   . Smokeless tobacco: Never Used  . Alcohol Use: 6.0 oz/week    10 Glasses of wine per week  . Drug Use: No  . Sexual Activity: Not on file   Other Topics Concern  . Not on file   Social History Narrative   Divorced 2011    No past surgical history on file.  Family History  Problem Relation Age of Onset  . Heart attack Mother   . Heart failure Mother   . Cancer Father     bone  . Colon cancer Neg Hx   . Rectal cancer Neg Hx   . Stomach cancer Neg Hx     No Known Allergies  Current Outpatient Prescriptions on File Prior to Visit  Medication Sig Dispense Refill  . lisinopril-hydrochlorothiazide (PRINZIDE,ZESTORETIC) 20-25 MG per tablet Take 1 tablet by mouth daily.  90 tablet  3   No current facility-administered medications on file prior to visit.     patient denies chest pain, shortness of breath, orthopnea. Denies lower extremity edema, abdominal pain, change in appetite, change in bowel movements. Patient denies rashes, musculoskeletal complaints. No other specific complaints in a complete review of systems.   Reviewed vitals  well-developed well-nourished male in no acute distress. HEENT exam atraumatic, normocephalic, neck supple without jugular venous distention. Chest clear to auscultation cardiac exam S1-S2 are regular. Abdominal exam overweight with bowel sounds, soft and nontender. Extremities no edema. Neurologic exam is alert with a normal gait.

## 2013-03-05 NOTE — Assessment & Plan Note (Signed)
BP Readings from Last 4 Encounters:  03/03/13 128/90  01/20/13 174/112  10/25/11 156/94  08/16/11 153/99   Fair control. Continue current medications.  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 I have completed the form.

## 2013-03-05 NOTE — Assessment & Plan Note (Signed)
Reviewed previous labs. I'll check a UA today.

## 2013-03-06 LAB — TB SKIN TEST
INDURATION: 5 mm
TB Skin Test: NEGATIVE

## 2013-03-06 NOTE — Progress Notes (Signed)
Pt was a couple of hours past the 72 hour mark for TB reading.  Per Dr Maudie Mercury ok to read without re-administering.  Pt tested positive.  Dr Maudie Mercury looked at it and it measured 66mm.  She told pt that if its 102mm and under than it is negative.

## 2013-03-06 NOTE — Addendum Note (Signed)
Addended by: Townsend Roger D on: 03/06/2013 11:20 AM   Modules accepted: Orders

## 2013-03-10 ENCOUNTER — Ambulatory Visit (INDEPENDENT_AMBULATORY_CARE_PROVIDER_SITE_OTHER): Payer: Managed Care, Other (non HMO) | Admitting: *Deleted

## 2013-03-10 DIAGNOSIS — Z23 Encounter for immunization: Secondary | ICD-10-CM

## 2013-03-14 ENCOUNTER — Telehealth: Payer: Self-pay | Admitting: Internal Medicine

## 2013-03-14 ENCOUNTER — Ambulatory Visit (INDEPENDENT_AMBULATORY_CARE_PROVIDER_SITE_OTHER): Payer: Managed Care, Other (non HMO) | Admitting: Family Medicine

## 2013-03-14 ENCOUNTER — Encounter: Payer: Self-pay | Admitting: Family Medicine

## 2013-03-14 VITALS — BP 128/86 | HR 100 | Temp 99.3°F | Ht 67.75 in | Wt 204.0 lb

## 2013-03-14 DIAGNOSIS — R109 Unspecified abdominal pain: Secondary | ICD-10-CM

## 2013-03-14 DIAGNOSIS — K5732 Diverticulitis of large intestine without perforation or abscess without bleeding: Secondary | ICD-10-CM

## 2013-03-14 LAB — POCT URINALYSIS DIPSTICK
Bilirubin, UA: NEGATIVE
Glucose, UA: NEGATIVE
Ketones, UA: NEGATIVE
Leukocytes, UA: NEGATIVE
Nitrite, UA: NEGATIVE
PH UA: 7
Spec Grav, UA: 1.02
UROBILINOGEN UA: 0.2

## 2013-03-14 MED ORDER — METRONIDAZOLE 500 MG PO TABS: 500.0000 mg | ORAL_TABLET | Freq: Two times a day (BID) | ORAL | Status: DC

## 2013-03-14 MED ORDER — CIPROFLOXACIN HCL 500 MG PO TABS: 500.0000 mg | ORAL_TABLET | Freq: Two times a day (BID) | ORAL | Status: DC

## 2013-03-14 NOTE — Telephone Encounter (Signed)
Patient Information:  Caller Name: Shane Estes  Phone: [phone]  Patient: Shane Estes, Shane Estes  Gender: Male  DOB: 1955/06/01  Age: 58 Years  PCP: Phoebe Sharps (Adults only)  Office Follow Up:  Does the office need to follow up with this patient?: No  Instructions For The Office: N/A  RN Note:  Triage RN spoke to [place] [facility] in office and pts original appt scheduled on Monday 3/16 was moved up to today 3/13 at 3pm with Dr. Sarajane Jews, per clinical staff. Pt agreed to plan and pt aware to call back immediately if sxs worsen or develops new sxs prior to appt today.  Symptoms  Reason For Call & Symptoms: Sharp intermittent LLQ pain last 2 days with hx of diverticulitis. States feels similar to when he had diverticulitis in past but not quite as bad. Doesn't believe he has a fever at this time. Does report some chills last night. No vomiting or diarrhea. Reports passing less stool recently and harder stools. Also reports passing urine is more difficult last couple days but is able to pass more than few drops; may take several attempts to empty.  Reviewed Health History In EMR: Yes  Reviewed Medications In EMR: Yes  Reviewed Allergies In EMR: Yes  Reviewed Surgeries / Procedures: Yes  Date of Onset of Symptoms: 03/12/2013  Treatments Tried: Laxative  Treatments Tried Worked: No  Guideline(s) Used:  Abdominal Pain - Male  Disposition Per Guideline:   Go to ED Now (or to Office with PCP Approval)  Reason For Disposition Reached:   Patient sounds very sick or weak to the triager  Advice Given:  Call Back If:  You become worse.  Patient Will Follow Care Advice:  YES  Appointment Scheduled:  03/14/2013 15:00:00 Appointment Scheduled Provider:  [facility])

## 2013-03-14 NOTE — Progress Notes (Signed)
Pre visit review using our clinic review tool, if applicable. No additional management support is needed unless otherwise documented below in the visit note. 

## 2013-03-14 NOTE — Progress Notes (Signed)
   Subjective:    Patient ID: Shane Estes, male    DOB: [birth year], 58 y.o.   MRN: [id number]  HPI Here for 3 days of mild LLQ pains that he thinks is another bout of diverticulitis. He had this once before, and he had diverticulae on his colonoscopy. The pains come and go.he has had loose stools without blood. No fever or nausea. No urinary sx.   Review of Systems  Constitutional: Negative.   Gastrointestinal: Positive for abdominal pain and diarrhea. Negative for nausea, vomiting, constipation, blood in stool, abdominal distention and rectal pain.  Genitourinary: Negative.        Objective:   Physical Exam  Constitutional: He appears well-developed and well-nourished.  Abdominal: Soft. Bowel sounds are normal. He exhibits no distension and no mass. There is no rebound and no guarding.  Mildly tender in the LLQ          Assessment & Plan:  Drink fluids and recheck prn

## 2013-03-15 ENCOUNTER — Encounter: Payer: Self-pay | Admitting: Family Medicine

## 2013-03-17 ENCOUNTER — Ambulatory Visit: Payer: Managed Care, Other (non HMO) | Admitting: Internal Medicine

## 2013-04-23 ENCOUNTER — Other Ambulatory Visit: Payer: Managed Care, Other (non HMO)

## 2013-04-30 ENCOUNTER — Other Ambulatory Visit: Payer: Managed Care, Other (non HMO)

## 2016-03-22 NOTE — Progress Notes (Signed)
HPI:   Shane Estes is a 61 y.o. male, who is here today to establish care.  Former PCP: Dr Shane Estes. Last preventive routine visit: 01/2013.  Chronic medical problems: HTN,HLD,ED,testosterone deficiency among some.  Hypertension:   Dx in 2013. He has not been on Lisinopril-HCTZ 20-25 mg BP's at home "pretty good" , "sometimes it goes up" 150-160/?, "not a lot" He has taken "natural vinegar" and bananas with honey.   He has not noted unusual headache, visual changes, exertional chest pain, dyspnea,  focal weakness, or edema.   Lab Results  Component Value Date   CREATININE 1.3 01/15/2013   BUN 13 01/15/2013   NA 138 01/15/2013   K 5.5 (H) 01/15/2013   CL 100 01/15/2013   CO2 29 01/15/2013     Hyperlipidemia:  Currently on non pharmacologic treatment.  Following a low fat diet: Not consistently.    Lab Results  Component Value Date   CHOL 246 (H) 01/15/2013   HDL 51.30 01/15/2013   LDLDIRECT 179.8 01/15/2013   TRIG 233.0 (H) 01/15/2013   CHOLHDL 5 01/15/2013     Concerns today:   -Lower back pain:  3 weeks of constant  lower back pain,bilateral,radiated to both LE's. Achy pain 6-7/10, constant, exacerbated when standing up, twisting,and prolonged walking. Alleviated factors not identified. He denies recent injury or unusual physical activity. Denies numbness or tingling,saddle anesthesia,or urine/bowel incontinence.   He has not taking analgesics OTC.  Years ago he was treated for a "swelling bone" "pressing on a nerve" in his lower back.  -Insomnia: Sleeps about 4 hours and wakes up frequently. He has tried OTC Tylenol pm but it caused drowsiness. 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   Review of Systems  Constitutional: Positive for fatigue. Negative for activity change, appetite change, fever and unexpected weight change.  HENT: Negative for nosebleeds, sore throat and trouble  swallowing.   Eyes: Negative for redness and visual disturbance.  Respiratory: Negative for cough, shortness of breath and wheezing.   Cardiovascular: Negative for chest pain, palpitations and leg swelling.  Gastrointestinal: Negative for abdominal pain, nausea and vomiting.  Genitourinary: Negative for decreased urine volume, dysuria and hematuria.  Musculoskeletal: Positive for back pain and myalgias. Negative for gait problem.  Neurological: Negative for dizziness, syncope, weakness, numbness and headaches.  Hematological: Negative for adenopathy. Does not bruise/bleed easily.  Psychiatric/Behavioral: Positive for sleep disturbance. Negative for confusion. The patient is not nervous/anxious.       No current outpatient prescriptions on file prior to visit.   No current facility-administered medications on file prior to visit.      Past Medical History:  Diagnosis Date  . Hyperlipidemia   . Hypertension    No Known Allergies  Family History  Problem Relation Age of Onset  . Heart attack Mother   . Heart failure Mother   . Cancer Father     bone  . Colon cancer Neg Hx   . Rectal cancer Neg Hx   . Stomach cancer Neg Hx     Social History   Social History  . Marital status: Divorced    Spouse name: N/A  . Number of children: N/A  . Years of education: N/A   Social History Main Topics  . Smoking status: Never Smoker  . Smokeless tobacco: Never Used  . Alcohol use 6.0 oz/week    10 Glasses of wine per week  .  Drug use: No  . Sexual activity: Not Asked   Other Topics Concern  . None   Social History Narrative   Divorced 2011    Vitals:   03/23/16 1013 03/23/16 1114  BP: 130/80 138/90  Pulse: 79   Resp: 12   O2 sat at RA 95% Body mass index is 31.57 kg/m.    Physical Exam  Nursing note and vitals reviewed. Constitutional: He is oriented to person, place, and time. He appears well-developed. No distress.  HENT:  Head: Atraumatic.  Mouth/Throat:  Oropharynx is clear and moist and mucous membranes are normal.  Eyes: Conjunctivae and EOM are normal. Pupils are equal, round, and reactive to light.  Neck: No tracheal deviation present. No thyromegaly present.  Cardiovascular: Normal rate and regular rhythm.   No murmur heard. Pulses:      Dorsalis pedis pulses are 2+ on the right side, and 2+ on the left side.  Respiratory: Effort normal and breath sounds normal. No respiratory distress.  GI: Soft. He exhibits no mass. There is no hepatomegaly. There is no tenderness.  Musculoskeletal: He exhibits no edema.  No significant deformity appreciated. No tenderness upon palpation of paraspinal muscles. Pain not elicited with movement on exam table during examination. SLR negative bilateral.   Lymphadenopathy:    He has no cervical adenopathy.  Neurological: He is alert and oriented to person, place, and time. He has normal strength. Coordination and gait normal.  Reflex Scores:      Patellar reflexes are 2+ on the right side and 2+ on the left side. Skin: Skin is warm. No erythema.  Psychiatric: He has a normal mood and affect.  Well groomed,good eye contact.      ASSESSMENT AND PLAN:   Shane Estes was seen today for establish care.  Diagnoses and all orders for this visit:    Chemistry      Component Value Date/Time   NA 138 03/23/2016 1119   K 5.2 (H) 03/23/2016 1119   CL 100 03/23/2016 1119   CO2 31 03/23/2016 1119   BUN 15 03/23/2016 1119   CREATININE 1.33 03/23/2016 1119      Component Value Date/Time   CALCIUM 10.6 (H) 03/23/2016 1119   ALKPHOS 87 03/23/2016 1119   AST 15 03/23/2016 1119   ALT 17 03/23/2016 1119   BILITOT 0.5 03/23/2016 1119     Lab Results  Component Value Date   CHOL 236 (H) 03/23/2016   HDL 51.60 03/23/2016   LDLCALC 157 (H) 03/23/2016   LDLDIRECT 179.8 01/15/2013   TRIG 140.0 03/23/2016   CHOLHDL 5 03/23/2016    Insomnia, unspecified type  Good sleep hygiene. We discussed a couple  treatment options,he agrees with trying Trazodone 25-50 mg at bedtime as needed.He needs to dedicate 8 hours to sleep. Some side effects discussed. F/U in 2 months.  -     traZODone (DESYREL) 50 MG tablet; Take 0.5-1 tablets (25-50 mg total) by mouth at bedtime as needed for sleep.  Acute bilateral low back pain, with sciatica presence unspecified  We discussed treatment options and current recommendations in regard steroid treatment. Because it is not better after 3 weeks,he agrees with taking Prednisone taper,some side effects discussed.  Instructed about warning signs. F/U in 2 months,before if needed. Imaging and/or ortho referral will be consider depending of whether or not there is clinic improvement.  -     predniSONE (DELTASONE) 20 MG tablet; 3 tabs for 3 days, 2 tabs for 3 days, 1 tabs  for 3 days, and 1/2 tab for 3 days. Take tables together with breakfast.  Essential hypertension  Reporting elevated BP's,today slightly above goal. Amlodipine 2.5 mg daily recommended. Continue monitoring BP. Low salt diet. Periodic eye exam. F/U in 2 months.  -     Comprehensive metabolic panel -     amLODipine (NORVASC) 2.5 MG tablet; Take 1 tablet (2.5 mg total) by mouth daily.  Hyperlipemia, mixed  Low fat diet discussed and recommended. Will follow labs done today and will give further recommendations accordingly.  -     Lipid panel  BMI 31.0-31.9,adult  We discussed benefits of wt loss as well as adverse effects of obesity. Consistency with healthy diet and physical activity recommended. Daily brisk walking for 15-30 min as tolerated.  After lab reviewed:  Hyperkalemia: Will repeat K+ before treatment is considered. K+ was elevated in the past. Hypercalcemia, will check iCa,iPTH,25 OH vit D,and lumbar X ray will be ordered.   Armanie Martine G. Martinique, MD  [facility]. [place] office.

## 2016-03-23 ENCOUNTER — Ambulatory Visit (INDEPENDENT_AMBULATORY_CARE_PROVIDER_SITE_OTHER): Payer: 59 | Admitting: Family Medicine

## 2016-03-23 ENCOUNTER — Encounter: Payer: Self-pay | Admitting: Family Medicine

## 2016-03-23 VITALS — BP 138/90 | HR 79 | Resp 12 | Ht 67.75 in | Wt 206.1 lb

## 2016-03-23 DIAGNOSIS — G4700 Insomnia, unspecified: Secondary | ICD-10-CM

## 2016-03-23 DIAGNOSIS — E875 Hyperkalemia: Secondary | ICD-10-CM

## 2016-03-23 DIAGNOSIS — I10 Essential (primary) hypertension: Secondary | ICD-10-CM

## 2016-03-23 DIAGNOSIS — Z6831 Body mass index (BMI) 31.0-31.9, adult: Secondary | ICD-10-CM | POA: Insufficient documentation

## 2016-03-23 DIAGNOSIS — E782 Mixed hyperlipidemia: Secondary | ICD-10-CM | POA: Diagnosis not present

## 2016-03-23 DIAGNOSIS — M545 Low back pain: Secondary | ICD-10-CM | POA: Diagnosis not present

## 2016-03-23 LAB — LIPID PANEL
Cholesterol: 236 mg/dL — ABNORMAL HIGH (ref 0–200)
HDL: 51.6 mg/dL (ref >39.00)
LDL Cholesterol: 157 mg/dL — ABNORMAL HIGH (ref 0–99)
NONHDL: 184.76
TRIGLYCERIDES: 140 mg/dL (ref 0.0–149.0)
Total CHOL/HDL Ratio: 5
VLDL: 28 mg/dL (ref 0.0–40.0)

## 2016-03-23 LAB — COMPREHENSIVE METABOLIC PANEL
ALBUMIN: 4.8 g/dL (ref 3.5–5.2)
ALT: 17 U/L (ref 0–53)
AST: 15 U/L (ref 0–37)
Alkaline Phosphatase: 87 U/L (ref 39–117)
BILIRUBIN TOTAL: 0.5 mg/dL (ref 0.2–1.2)
BUN: 15 mg/dL (ref 6–23)
CALCIUM: 10.6 mg/dL — AB (ref 8.4–10.5)
CO2: 31 mEq/L (ref 19–32)
Chloride: 100 mEq/L (ref 96–112)
Creatinine, Ser: 1.33 mg/dL (ref 0.40–1.50)
GFR: 70.75 mL/min (ref >60.00)
GLUCOSE: 95 mg/dL (ref 70–99)
Potassium: 5.2 mEq/L — ABNORMAL HIGH (ref 3.5–5.1)
Sodium: 138 mEq/L (ref 135–145)
TOTAL PROTEIN: 8.1 g/dL (ref 6.0–8.3)

## 2016-03-23 MED ORDER — AMLODIPINE BESYLATE 2.5 MG PO TABS: 2.5000 mg | ORAL_TABLET | Freq: Every day | ORAL | 2 refills | Status: DC

## 2016-03-23 MED ORDER — TRAZODONE HCL 50 MG PO TABS: 25.0000 mg | ORAL_TABLET | Freq: Every evening | ORAL | 1 refills | Status: DC | PRN

## 2016-03-23 MED ORDER — PREDNISONE 20 MG PO TABS: ORAL_TABLET | ORAL | 0 refills | Status: AC

## 2016-03-23 NOTE — Progress Notes (Signed)
Pre visit review using our clinic review tool, if applicable. No additional management support is needed unless otherwise documented below in the visit note. 

## 2016-03-23 NOTE — Patient Instructions (Addendum)
A few things to remember from today's visit:   Essential hypertension - Plan: Comprehensive metabolic panel, amLODipine (NORVASC) 2.5 MG tablet  Hyperlipemia, mixed - Plan: Lipid panel  Acute bilateral low back pain, with sciatica presence unspecified  Insomnia, unspecified type - Plan: traZODone (DESYREL) 50 MG tablet   Please be sure medication list is accurate. If a new problem present, please set up appointment sooner than planned today.

## 2016-03-27 ENCOUNTER — Other Ambulatory Visit: Payer: Self-pay

## 2016-03-27 MED ORDER — ATORVASTATIN CALCIUM 10 MG PO TABS: 10.0000 mg | ORAL_TABLET | Freq: Every day | ORAL | 1 refills | Status: AC

## 2016-05-19 ENCOUNTER — Other Ambulatory Visit: Payer: Self-pay

## 2016-05-19 ENCOUNTER — Ambulatory Visit (INDEPENDENT_AMBULATORY_CARE_PROVIDER_SITE_OTHER)
Admission: RE | Admit: 2016-05-19 | Discharge: 2016-05-19 | Disposition: A | Discharge status: Home / Self Care | Payer: 59 | Source: Ambulatory Visit | Attending: Family Medicine | Admitting: Family Medicine

## 2016-05-19 DIAGNOSIS — M545 Low back pain: Secondary | ICD-10-CM

## 2016-05-24 ENCOUNTER — Telehealth: Payer: Self-pay

## 2016-05-24 DIAGNOSIS — M479 Spondylosis, unspecified: Secondary | ICD-10-CM

## 2016-05-24 NOTE — Telephone Encounter (Signed)
Patient called to report that he has increasing tingling and numbness down both legs to his feet. He states that the symptoms increase with standing and sometimes make it hard to walk. He states that several years ago he was given some anti-inflammatory medication that helped. He would like to know what you would recommend.  Dr. Martinique - Please advise. Thanks!

## 2016-05-24 NOTE — Telephone Encounter (Signed)
Last OV we discussed plan: He was prescribed Prednisone taper and we agree that if this was worse or not better he needed ortho evaluation. So it seems like it is getting worse, ortho evaluation recommended.  Thanks, BJ

## 2016-05-24 NOTE — Telephone Encounter (Signed)
Spoke with pt and he states that oral prednisone taper did not help much. He agrees to ortho referral. Order placed. Pt aware that someone will contact him to schedule. Nothing further needed at this time.

## 2016-06-14 ENCOUNTER — Encounter: Payer: Self-pay | Admitting: Internal Medicine

## 2016-06-16 ENCOUNTER — Ambulatory Visit (INDEPENDENT_AMBULATORY_CARE_PROVIDER_SITE_OTHER): Payer: 59 | Admitting: Orthopedic Surgery

## 2016-06-16 ENCOUNTER — Encounter (INDEPENDENT_AMBULATORY_CARE_PROVIDER_SITE_OTHER): Payer: Self-pay | Admitting: Orthopedic Surgery

## 2016-06-16 DIAGNOSIS — M5441 Lumbago with sciatica, right side: Secondary | ICD-10-CM

## 2016-06-16 DIAGNOSIS — M5442 Lumbago with sciatica, left side: Secondary | ICD-10-CM

## 2016-06-16 MED ORDER — METHOCARBAMOL 500 MG PO TABS: 500.0000 mg | ORAL_TABLET | Freq: Three times a day (TID) | ORAL | 0 refills | Status: DC | PRN

## 2016-06-16 MED ORDER — PREDNISONE 5 MG (21) PO TBPK: ORAL_TABLET | ORAL | 0 refills | Status: DC

## 2016-06-16 NOTE — Progress Notes (Signed)
Office Visit Note   Patient: Shane Estes           Date of Birth: [birth year]           MRN: [id number] Visit Date: 06/16/2016 Requested by: Martinique, Betty G, MD [address] [place], [place] [address] PCP: Martinique, Betty G, MD  Subjective: Chief Complaint  Patient presents with  . Lower Back - Pain    HPI: Shane Estes is a patient with a three-year history of mild back pain but severe radicular pain into the legs left worse than right.  He describes numbness and tingling in his legs and feet.  He does not have diabetes.  Denies any history of injury but he states that the pain is constant and is been worse over the past several months.  He does wake from sleep most most nights because of the pain.  His heart for him to get comfortable.  He's had plain radiographs done of the lumbar spine which are reviewed and which are pretty unremarkable in terms of absence of facet arthritis and degenerative disc disease.  He's tried some over-the-counter medication for his symptoms.  He does report primarily plantar foot numbness and tingling.  Denies any fevers chills or saddle paresthesias.              ROS: All systems reviewed are negative as they relate to the chief complaint within the history of present illness.  Patient denies  fevers or chills.   Assessment & Plan: Visit Diagnoses:  1. Midline low back pain with bilateral sciatica, unspecified chronicity     Plan: Impression is significant back and bilateral leg pain with numbness and tingling left worse than right.  No weakness on exam today but he densely has something going on in his back most likely which is giving him bilateral leg symptoms.  Does have pain with lifting.  I will put him on a Medrol Dosepak Robaxin and also order MRI of his lumbar spine to evaluate bilateral numbness and tingling in his feet.  It is not really activity related 7 doesn't look like spinal stenosis either by history or by the absence of  degenerative changes on his radiographs.  Could be a central disc which is giving him the problem which is chronic but slowly enlarging.  I'll see him back after that study.  Follow-Up Instructions: Return for after MRI.   Orders:  Orders Placed This Encounter  Procedures  . MR Lumbar Spine w/o contrast   Meds ordered this encounter  Medications  . DISCONTD: predniSONE (STERAPRED UNI-PAK 21 TAB) 5 MG (21) TBPK tablet    Sig: Take dose pak as directed    Dispense:  21 tablet    Refill:  0  . DISCONTD: methocarbamol (ROBAXIN) 500 MG tablet    Sig: Take 1 tablet (500 mg total) by mouth every 8 (eight) hours as needed for muscle spasms.    Dispense:  30 tablet    Refill:  0  . methocarbamol (ROBAXIN) 500 MG tablet    Sig: Take 1 tablet (500 mg total) by mouth every 8 (eight) hours as needed for muscle spasms.    Dispense:  30 tablet    Refill:  0  . predniSONE (STERAPRED UNI-PAK 21 TAB) 5 MG (21) TBPK tablet    Sig: Take dose pak as directed    Dispense:  21 tablet    Refill:  0      Procedures: No procedures performed  Clinical Data: No additional findings.  Objective: Vital Signs: There were no vitals taken for this visit.  Physical Exam:   Constitutional: Patient appears well-developed HEENT:  Head: Normocephalic Eyes:EOM are normal Neck: Normal range of motion Cardiovascular: Normal rate Pulmonary/chest: Effort normal Neurologic: Patient is alert Skin: Skin is warm Psychiatric: Patient has normal mood and affect    Ortho Exam: Orthopedic exam demonstrates normal gait alignment no definite nerve root tension signs negative density negative clonus palpable pedal pulses slightly more reflexes 2+ out of 4 on the right patella and Achilles compared to the left.  Negative Babinski negative clonus does have some paresthesias on the plantar aspect of both feet but not really dorsal.  No saddle paresthesias present.  He has very good flexibility bending down to touch  his toes and no real significant pain with extension.  No trochanteric tenderness is noted.  No other masses lymph adenopathy or skin changes noted in the back region  Specialty Comments:  No specialty comments available.  Imaging: No results found.   PMFS History: Patient Active Problem List   Diagnosis Date Noted  . Insomnia 03/23/2016  . BMI 31.0-31.9,adult 03/23/2016  . Hypertension 10/25/2011  . HEMATURIA 02/16/2009  . TESTOSTERONE DEFICIENCY 10/19/2006  . Hyperlipemia, mixed 10/15/2006  . ERECTILE DYSFUNCTION 10/15/2006   Past Medical History:  Diagnosis Date  . Hyperlipidemia   . Hypertension     Family History  Problem Relation Age of Onset  . Heart attack Mother   . Heart failure Mother   . Cancer Father        bone  . Colon cancer Neg Hx   . Rectal cancer Neg Hx   . Stomach cancer Neg Hx     No past surgical history on file. Social History   Occupational History  . Not on file.   Social History Main Topics  . Smoking status: Never Smoker  . Smokeless tobacco: Never Used  . Alcohol use 6.0 oz/week    10 Glasses of wine per week  . Drug use: No  . Sexual activity: Not on file

## 2016-06-21 ENCOUNTER — Telehealth (INDEPENDENT_AMBULATORY_CARE_PROVIDER_SITE_OTHER): Payer: Self-pay

## 2016-06-21 MED ORDER — CYCLOBENZAPRINE HCL 5 MG PO TABS: 5.0000 mg | ORAL_TABLET | Freq: Three times a day (TID) | ORAL | 0 refills | Status: DC | PRN

## 2016-06-21 NOTE — Telephone Encounter (Signed)
Received notification from patients pharmacy that robaxin is on back order that you prescribed for patient. Do you want to change to flexeril?

## 2016-06-21 NOTE — Addendum Note (Signed)
Addended byLaurann Montana on: 06/21/2016 02:10 PM   Modules accepted: Orders

## 2016-06-21 NOTE — Telephone Encounter (Signed)
Y 5 mg po q 8 # 30 pls clal htx

## 2016-06-21 NOTE — Telephone Encounter (Signed)
done

## 2016-07-02 ENCOUNTER — Other Ambulatory Visit: Payer: 59

## 2016-08-15 ENCOUNTER — Other Ambulatory Visit: Payer: Self-pay | Admitting: Family Medicine

## 2016-08-15 DIAGNOSIS — G4700 Insomnia, unspecified: Secondary | ICD-10-CM

## 2016-10-16 ENCOUNTER — Other Ambulatory Visit: Payer: Self-pay | Admitting: Family Medicine

## 2016-10-16 DIAGNOSIS — I10 Essential (primary) hypertension: Secondary | ICD-10-CM

## 2016-12-13 ENCOUNTER — Other Ambulatory Visit: Payer: Self-pay

## 2016-12-13 ENCOUNTER — Ambulatory Visit (HOSPITAL_COMMUNITY)
Admission: EM | Admit: 2016-12-13 | Discharge: 2016-12-13 | Disposition: A | Discharge status: Home / Self Care | Payer: Worker's Compensation | Attending: Family Medicine | Admitting: Family Medicine

## 2016-12-13 ENCOUNTER — Encounter (HOSPITAL_COMMUNITY): Payer: Self-pay | Admitting: Emergency Medicine

## 2016-12-13 ENCOUNTER — Ambulatory Visit: Payer: Self-pay | Admitting: *Deleted

## 2016-12-13 DIAGNOSIS — M5441 Lumbago with sciatica, right side: Secondary | ICD-10-CM

## 2016-12-13 DIAGNOSIS — M545 Low back pain: Secondary | ICD-10-CM

## 2016-12-13 MED ORDER — PREDNISONE 5 MG (21) PO TBPK: ORAL_TABLET | ORAL | 0 refills | Status: AC

## 2016-12-13 MED ORDER — CYCLOBENZAPRINE HCL 5 MG PO TABS: 5.0000 mg | ORAL_TABLET | Freq: Three times a day (TID) | ORAL | 0 refills | Status: AC | PRN

## 2016-12-13 NOTE — Discharge Instructions (Signed)
Ibuprofen as needed for pain, take with food.  Complete course of steroids to also help with pain. May take muscle relaxer as needed at night for muscle spasms.  Do not lift greater than 25lbs for the next three days. If need further work restrictions please follow up with your primary care provider for reevaluation.  May need to follow up with orthopedics as previous saw for further evaluation and treatment.

## 2016-12-13 NOTE — Telephone Encounter (Signed)
I spoke with pt and he is on the way to Urgent care for evaluation.

## 2016-12-13 NOTE — ED Notes (Signed)
Informed Amy with pt BP
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
## 2016-12-15 ENCOUNTER — Ambulatory Visit: Payer: Self-pay | Admitting: Family Medicine

## 2016-12-15 ENCOUNTER — Ambulatory Visit (INDEPENDENT_AMBULATORY_CARE_PROVIDER_SITE_OTHER)
Admission: RE | Admit: 2016-12-15 | Discharge: 2016-12-15 | Disposition: A | Discharge status: Home / Self Care | Payer: 59 | Source: Ambulatory Visit | Attending: Family Medicine | Admitting: Family Medicine

## 2016-12-15 ENCOUNTER — Ambulatory Visit: Payer: Worker's Compensation | Admitting: Family Medicine

## 2016-12-15 ENCOUNTER — Encounter: Payer: Self-pay | Admitting: Family Medicine

## 2016-12-15 ENCOUNTER — Encounter: Payer: Self-pay | Admitting: *Deleted

## 2016-12-15 DIAGNOSIS — M542 Cervicalgia: Secondary | ICD-10-CM

## 2016-12-15 DIAGNOSIS — R519 Headache, unspecified: Secondary | ICD-10-CM

## 2016-12-15 DIAGNOSIS — R51 Headache: Secondary | ICD-10-CM

## 2016-12-15 DIAGNOSIS — M5441 Lumbago with sciatica, right side: Secondary | ICD-10-CM
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
## 2016-12-18 ENCOUNTER — Telehealth: Payer: Self-pay | Admitting: Family Medicine

## 2016-12-18 NOTE — Telephone Encounter (Signed)
Received a CRM from [employer] patient would like like Dr Martinique or his assistance to give a call back @[phone]. In reference to the referral to [facility] imaging ,  Has questions about the referral because of his insurance . He wanted to know why the Mri wasn't file under workers comp .  I called the patient back to see what it is that he is needing regarding authorization , because I submitted a request for authorization on 12-15-2016 with [facility] - I was given a message "This member is not currently active in the [facility]. Please contact a Customer Care Professional at [phone] if you believe the information returned to be in error". On the [facility]  [place] . I informed pt of this . He stated that he no longer has the [facility] as insurance , and this is a workers comp case . He also stated that he informed the lady that checked him in [place] )  That  this was a workers comp case . He wanted to our office to file the MRI with his worker comp . I did not have his worker comp information to  do so . Pt states he has this information regarding the lawyer and case info  who is handing this case and he can provide the information . I did not know how to proceeded with this worker comp case so I am forwarding to sheena  for  help . I informed pt that our office supervisor sheena will give him a call this week to talk with him about  this , Sunday Spillers placed the office notes on shenna desk to review .

## 2016-12-19 NOTE — Telephone Encounter (Signed)
Spoke with pt and he gave me contact information for his attorney and employer: [employer] [phone] and [employer] @ [address] [place] [phone]  Spoke with Donella Stade and she gave me contact information for the [place] and a claim nbr [id number] Rico Junker @ [place] [phone]  Spoke with Margreta Journey and she states that they just received the claim yesterday so they do not have any records or other information necessary to approve any further medical treatment or imaging at this time. She advised that the pt needs to contact his attorney to make sure a records release is signed.   Spoke with pt and advised. He will contact his attorney.

## 2016-12-20 ENCOUNTER — Encounter: Payer: Self-pay | Admitting: *Deleted

## 2016-12-20 ENCOUNTER — Telehealth: Payer: Self-pay | Admitting: Family Medicine

## 2016-12-20 NOTE — Telephone Encounter (Signed)
Spoke with patient. Letter for work is at [employer].

## 2016-12-20 NOTE — Telephone Encounter (Signed)
Copied from [place] [phone]. Topic: Appointment Scheduling - Scheduling Inquiry for Clinic >> Dec 20, 2016  1:19 PM Shane Estes wrote: Reason for CRM: Pt states he was given a letter to be out of work  stating he would be going back to work on 12/21.  He is not feeling any  better and has an apt at [employer] on 12/22  and he will not be able to go back to work on  12/21   He wants to know if needs to make another apt. To have this date changed.

## 2016-12-20 NOTE — Telephone Encounter (Signed)
Routed to PCP for review

## 2016-12-20 NOTE — Telephone Encounter (Signed)
Referral to ortho was already placed. If he wants another letter for work we can provide one for another week.  Thanks, BJ

## 2016-12-22 ENCOUNTER — Telehealth: Payer: Self-pay | Admitting: Family Medicine

## 2016-12-22 NOTE — Telephone Encounter (Signed)
Copied from [place] [phone]. Topic: General - Other >> Dec 22, 2016  9:41 AM Ahmed Prima L wrote: Reason for CRM:   Pt is suppose to have a MRI tomorrow at [facility]. Patient states that they called him & they have not heard back from Dr Martinique. They need the authorization for the workers comp.  [phone] ([facility])

## 2016-12-22 NOTE — Telephone Encounter (Signed)
Please see note from previous TE:   Rico Junker @ [place] [phone]  Spoke with Margreta Journey and she states that they just received the claim yesterday so they do not have any records or other information necessary to approve any further medical treatment or imaging at this time. She advised that the pt needs to contact his attorney to make sure a records release is signed.   Spoke with pt and advised. He will contact his attorney.

## 2016-12-23 ENCOUNTER — Other Ambulatory Visit: Payer: Self-pay

## 2016-12-27 ENCOUNTER — Telehealth: Payer: Self-pay | Admitting: Family Medicine

## 2016-12-27 NOTE — Telephone Encounter (Signed)
Pt requesting to extend his work note, originally going back on 12/27/2016, not sure now when he can return, does not feel that he can perform job duties at this time. He was scheduled for imaging this weekend however they didn't have all the information needed regarding workers comp claim number. He has to reschedule this test. Please advise about the work note.

## 2017-01-01 ENCOUNTER — Telehealth (INDEPENDENT_AMBULATORY_CARE_PROVIDER_SITE_OTHER): Payer: Self-pay | Admitting: Orthopedic Surgery

## 2017-01-01 NOTE — Telephone Encounter (Signed)
Patient is new and has appt with Dr. Marlou Sa on 01/11/17. He has been seeing Dr. Martinique and they advised him to call to see if we would give him an out of work note at least until his appt. He said they would not give him another one since they had already written one for 2 weeks. We both thought this was kind of odd since they know about his condition but I told him I would send a message. Please advise # [phone]

## 2017-01-01 NOTE — Telephone Encounter (Signed)
Spoke with patient, patient verbalized understanding. Patient stated that he is still in pain and doesn't feel he need to return to work until he is feeling half way better.

## 2017-01-01 NOTE — Telephone Encounter (Signed)
He can request further work excuses for ortho during his visit. I have provided 2 weeks at this point, at this point I think he should try going back to work.  Thanks, BJ

## 2017-01-03 NOTE — Telephone Encounter (Addendum)
Patient would like to discuss auth for MRI mentioned below, please advise best # [phone]

## 2017-01-03 NOTE — Telephone Encounter (Signed)
IC s/w pt to clarify what he was needing from [place]. He stated that at this time nothing was needed and that there had been some confusion so current physician treating him has provided him with information needed.

## 2017-01-09 NOTE — Telephone Encounter (Signed)
Called patient to follow-up. He has MRI scheduled for tomorrow and as far as he knows nothing further is needed at this time, but he will contact the office if anything changes.

## 2017-01-10 ENCOUNTER — Other Ambulatory Visit: Payer: Self-pay

## 2017-01-11 ENCOUNTER — Ambulatory Visit (INDEPENDENT_AMBULATORY_CARE_PROVIDER_SITE_OTHER): Payer: Self-pay | Admitting: Orthopedic Surgery

## 2017-02-12 ENCOUNTER — Ambulatory Visit: Payer: Self-pay | Admitting: Licensed Clinical Social Worker

## 2017-11-10 ENCOUNTER — Other Ambulatory Visit: Payer: Self-pay | Admitting: Family Medicine

## 2017-11-10 DIAGNOSIS — G4700 Insomnia, unspecified: Secondary | ICD-10-CM

## 2017-11-22 ENCOUNTER — Other Ambulatory Visit: Payer: Self-pay | Admitting: Family Medicine

## 2017-11-22 DIAGNOSIS — G4700 Insomnia, unspecified: Secondary | ICD-10-CM

## 2017-11-23 ENCOUNTER — Other Ambulatory Visit: Payer: Self-pay | Admitting: Family Medicine

## 2017-11-23 DIAGNOSIS — G4700 Insomnia, unspecified: Secondary | ICD-10-CM

## 2017-11-27 ENCOUNTER — Other Ambulatory Visit: Payer: Self-pay | Admitting: Family Medicine

## 2017-11-27 DIAGNOSIS — G4700 Insomnia, unspecified: Secondary | ICD-10-CM

## 2018-02-12 ENCOUNTER — Emergency Department (HOSPITAL_COMMUNITY): Payer: Self-pay

## 2018-02-12 ENCOUNTER — Emergency Department (HOSPITAL_COMMUNITY)
Admission: EM | Admit: 2018-02-12 | Discharge: 2018-02-12 | Disposition: A | Discharge status: Home / Self Care | Payer: Self-pay | Attending: Emergency Medicine | Admitting: Emergency Medicine

## 2018-02-12 ENCOUNTER — Encounter (HOSPITAL_COMMUNITY): Payer: Self-pay

## 2018-02-12 DIAGNOSIS — R200 Anesthesia of skin: Secondary | ICD-10-CM | POA: Insufficient documentation

## 2018-02-12 DIAGNOSIS — I10 Essential (primary) hypertension: Secondary | ICD-10-CM | POA: Insufficient documentation

## 2018-02-12 DIAGNOSIS — Z79899 Other long term (current) drug therapy: Secondary | ICD-10-CM | POA: Insufficient documentation

## 2018-02-12 LAB — COMPREHENSIVE METABOLIC PANEL
ALBUMIN: 4.6 g/dL (ref 3.5–5.0)
ALT: 24 U/L (ref 0–44)
ANION GAP: 10 (ref 5–15)
AST: 21 U/L (ref 15–41)
Alkaline Phosphatase: 95 U/L (ref 38–126)
BUN: 10 mg/dL (ref 6–20)
CHLORIDE: 105 mmol/L (ref 98–111)
CO2: 25 mmol/L (ref 22–32)
Calcium: 9.8 mg/dL (ref 8.9–10.3)
Creatinine, Ser: 1.27 mg/dL — ABNORMAL HIGH (ref 0.61–1.24)
GFR calc Af Amer: >60 mL/min (ref >60)
GFR calc non Af Amer: >60 mL/min (ref >60)
GLUCOSE: 100 mg/dL — AB (ref 70–99)
POTASSIUM: 4.7 mmol/L (ref 3.5–5.1)
SODIUM: 140 mmol/L (ref 135–145)
TOTAL PROTEIN: 8.9 g/dL — AB (ref 6.5–8.1)
Total Bilirubin: 1 mg/dL (ref 0.3–1.2)

## 2018-02-12 LAB — DIFFERENTIAL
ABS IMMATURE GRANULOCYTES: 0.03 10*3/uL (ref 0.00–0.07)
BASOS ABS: 0.1 10*3/uL (ref 0.0–0.1)
BASOS PCT: 1 %
EOS ABS: 0.1 10*3/uL (ref 0.0–0.5)
EOS PCT: 1 %
IMMATURE GRANULOCYTES: 0 %
Lymphocytes Relative: 31 %
Lymphs Abs: 2.6 10*3/uL (ref 0.7–4.0)
Monocytes Absolute: 0.6 10*3/uL (ref 0.1–1.0)
Monocytes Relative: 7 %
NEUTROS PCT: 60 %
Neutro Abs: 5.1 10*3/uL (ref 1.7–7.7)

## 2018-02-12 LAB — CBC
HCT: 50.9 % (ref 39.0–52.0)
Hemoglobin: 16.5 g/dL (ref 13.0–17.0)
MCH: 28.6 pg (ref 26.0–34.0)
MCHC: 32.4 g/dL (ref 30.0–36.0)
MCV: 88.4 fL (ref 80.0–100.0)
NRBC: 0 % (ref 0.0–0.2)
PLATELETS: 384 10*3/uL (ref 150–400)
RBC: 5.76 MIL/uL (ref 4.22–5.81)
RDW: 13.6 % (ref 11.5–15.5)
WBC: 8.4 10*3/uL (ref 4.0–10.5)

## 2018-02-12 LAB — APTT: APTT: 29 s (ref 24–36)

## 2018-02-12 LAB — PROTIME-INR
INR: 1
Prothrombin Time: 13.1 seconds (ref 11.4–15.2)

## 2018-02-12 MED ORDER — SODIUM CHLORIDE 0.9% FLUSH: 3.0000 mL | Freq: Once | INTRAVENOUS | Status: DC

## 2018-02-12 MED ORDER — AMLODIPINE BESYLATE 2.5 MG PO TABS: 2.5000 mg | ORAL_TABLET | Freq: Every day | ORAL | 1 refills | Status: DC

## 2018-02-12 NOTE — ED Triage Notes (Signed)
Pt presents for evaluation of bilateral headache x 2-3 days, reports used to be on BP meds but has been off for about a year. Reports his left arm has constant numbness x "several months." states he also has some issues intermittently with L foot dragging but this has been over a year

## 2018-02-12 NOTE — ED Provider Notes (Signed)
[facility] Provider Note   CSN: [id number] Arrival date & time: 02/12/18  1418     History   Chief Complaint Chief Complaint  Patient presents with  . Headache  . Numbness    HPI Shane Estes is a 63 y.o. male.  63 year old male with history of hypertension who has not been taking his medications presents with intermittent headaches similar to when he has had high blood pressure.  Denies any visual changes with this.  States for over a month or so he has occasional left arm paresthesias which seem to be positional.  Denies any chest pain or shortness of breath.  Patient is not had any syncope or near syncope.  States at times when he walks his foot gets numb but they resolve spontaneously.  Has not sought treatment for this.     Past Medical History:  Diagnosis Date  . Hyperlipidemia   . Hypertension     Patient Active Problem List   Diagnosis Date Noted  . Insomnia 03/23/2016  . BMI 31.0-31.9,adult 03/23/2016  . Hypertension 10/25/2011  . HEMATURIA 02/16/2009  . TESTOSTERONE DEFICIENCY 10/19/2006  . Hyperlipemia, mixed 10/15/2006  . ERECTILE DYSFUNCTION 10/15/2006    History reviewed. No pertinent surgical history.      Home Medications    Prior to Admission medications   Medication Sig Start Date End Date Taking? Authorizing Provider  amLODipine (NORVASC) 2.5 MG tablet TAKE 1 TABLET BY MOUTH ONCE DAILY 10/16/16   Martinique, Betty G, MD  atorvastatin (LIPITOR) 10 MG tablet Take 1 tablet (10 mg total) by mouth daily. 03/27/16   Martin[place], Betty G, MD  cyclobenzaprine (FLEXERIL) 5 MG tablet Take 1 tablet (5 mg total) by mouth every 8 (eight) hours as needed for muscle spasms. Not while driving. 53/29/92   Zigmund Gottron, NP  predniSONE (STERAPRED UNI-PAK 21 TAB) 5 MG (21) TBPK tablet Take dose pak as directed 12/13/16   Zigmund Gottron, NP  traZODone (DESYREL) 50 MG tablet TAKE 1/2-1 TABLET BY MOUTH  AT BEDTIME AS NEEDED  FOR SLEEP 08/15/16   Martin[place], Betty G, MD    Family History Family History  Problem Relation Age of Onset  . Heart attack Mother   . Heart failure Mother   . Cancer Father        bone  . Colon cancer Neg Hx   . Rectal cancer Neg Hx   . Stomach cancer Neg Hx     Social History Social History   Tobacco Use  . Smoking status: Never Smoker  . Smokeless tobacco: Never Used  Substance Use Topics  . Alcohol use: Yes    Alcohol/week: 10.0 standard drinks    Types: 10 Glasses of wine per week  . Drug use: No     Allergies   Patient has no known allergies.   Review of Systems Review of Systems  All other systems reviewed and are negative.    Physical Exam Updated Vital Signs BP (!) 188/116 (BP Location: Right Arm)   Pulse 99   Temp 98.1 F (36.7 C) (Oral)   Resp 16   SpO2 100%   Physical Exam Vitals signs and nursing note reviewed.  Constitutional:      General: He is not in acute distress.    Appearance: Normal appearance. He is well-developed. He is not toxic-appearing.  HENT:     Head: Normocephalic and atraumatic.  Eyes:     General: Lids are normal.  Conjunctiva/sclera: Conjunctivae normal.     Pupils: Pupils are equal, round, and reactive to light.  Neck:     Musculoskeletal: Normal range of motion and neck supple.     Thyroid: No thyroid mass.     Trachea: No tracheal deviation.  Cardiovascular:     Rate and Rhythm: Normal rate and regular rhythm.     Heart sounds: Normal heart sounds. No murmur. No gallop.   Pulmonary:     Effort: Pulmonary effort is normal. No respiratory distress.     Breath sounds: Normal breath sounds. No stridor. No decreased breath sounds, wheezing, rhonchi or rales.  Abdominal:     General: Bowel sounds are normal. There is no distension.     Palpations: Abdomen is soft.     Tenderness: There is no abdominal tenderness. There is no rebound.  Musculoskeletal: Normal range of motion.        General: No tenderness.   Skin:    General: Skin is warm and dry.     Findings: No abrasion or rash.  Neurological:     Mental Status: He is alert and oriented to person, place, and time.     GCS: GCS eye subscore is 4. GCS verbal subscore is 5. GCS motor subscore is 6.     Cranial Nerves: No cranial nerve deficit.     Sensory: No sensory deficit.     Motor: No weakness.     Coordination: Romberg sign negative.     Gait: Gait and tandem walk normal.  Psychiatric:        Speech: Speech normal.        Behavior: Behavior normal.      ED Treatments / Results  Labs (all labs ordered are listed, but only abnormal results are displayed) Labs Reviewed  COMPREHENSIVE METABOLIC PANEL - Abnormal; Notable for the following components:      Result Value   Glucose, Bld 100 (*)    Creatinine, Ser 1.27 (*)    Total Protein 8.9 (*)    All other components within normal limits  PROTIME-INR  APTT  CBC  DIFFERENTIAL    EKG EKG Interpretation  Date/Time:  Tuesday February 12 2018 15:02:39 EST Ventricular Rate:  100 PR Interval:  166 QRS Duration: 80 QT Interval:  352 QTC Calculation: 454 R Axis:   113 Text Interpretation:  Normal sinus rhythm Possible Left atrial enlargement Right axis deviation Anterior infarct , age undetermined Abnormal ECG Confirmed by Lacretia Leigh ([id number]) on 02/12/2018 5:21:39 PM   Radiology Ct Head Wo Contrast  Result Date: 02/12/2018 CLINICAL DATA:  Headache EXAM: CT HEAD WITHOUT CONTRAST TECHNIQUE: Contiguous axial images were obtained from the base of the skull through the vertex without intravenous contrast. COMPARISON:  None. FINDINGS: Brain: No evidence of acute infarction, hemorrhage, hydrocephalus, extra-axial collection or mass lesion/mass effect. Periventricular white matter hypodensity. Vascular: No hyperdense vessel or unexpected calcification. Skull: Normal. Negative for fracture or focal lesion. Sinuses/Orbits: No acute finding. Other: None. IMPRESSION: No acute  intracranial pathology. No acute non-contrast CT findings to explain headache. Small-vessel white matter disease. Electronically Signed   By: Eddie Candle M.D.   On: 02/12/2018 16:50    Procedures Procedures (including critical care time)  Medications Ordered in ED Medications  sodium chloride flush (NS) 0.9 % injection 3 mL (has no administration in time range)     Initial Impression / Assessment and Plan / ED Course  I have reviewed the triage vital signs and the nursing notes.  Pertinent labs & imaging results that were available during my care of the patient were reviewed by me and considered in my medical decision making (see chart for details).     Work up appears reassuring with exception of mildly elevated blood pressure.  No gross focal deficits.  Symptoms have been for greater than a month.  Head CT negative.  Low suspicion for TIA or stroke.  Strength is equal throughout.  Will place patient back on his amlodipine and he will follow-up with his doctor.  Final Clinical Impressions(s) / ED Diagnoses   Final diagnoses:  None    ED Discharge Orders    None       Lacretia Leigh, MD 02/12/18 1725

## 2018-02-16 ENCOUNTER — Other Ambulatory Visit: Payer: Self-pay | Admitting: Family Medicine

## 2018-02-16 DIAGNOSIS — I10 Essential (primary) hypertension: Secondary | ICD-10-CM

## 2020-05-26 IMAGING — CT CT HEAD W/O CM
4 series · 16 of 47 positions shown, 18 images · non-contrast
Comparison: None.

CLINICAL DATA: Headache

EXAM:
CT HEAD WITHOUT CONTRAST
TECHNIQUE: Contiguous axial images were obtained from the base of the skull
through the vertex without intravenous contrast.

[Series 3: head wo · axial · 0.43mm/px · z∈[-135,-15]mm · 7 of 32 slices shown, 9 images]
[im 4/32  brain]
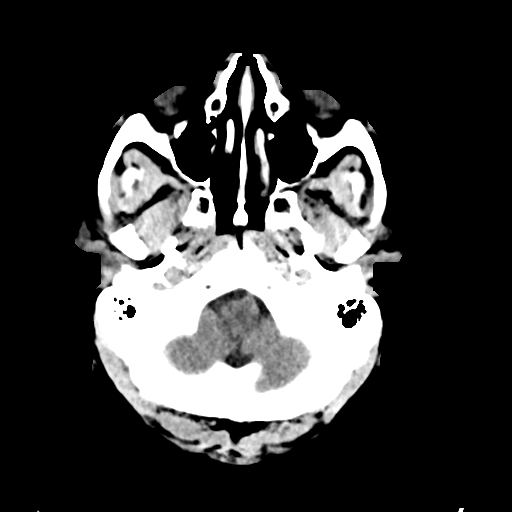
[im 4/32  bone]
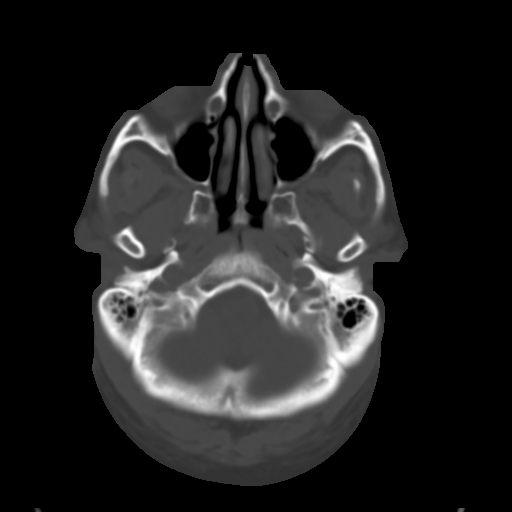
[im 8/32  brain]
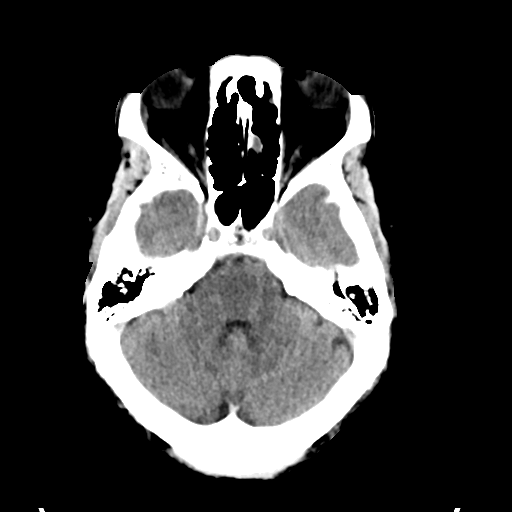
[im 12/32  brain]
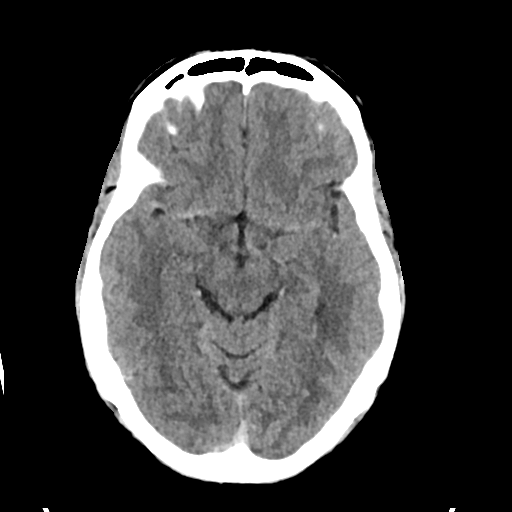
[im 16/32  brain]
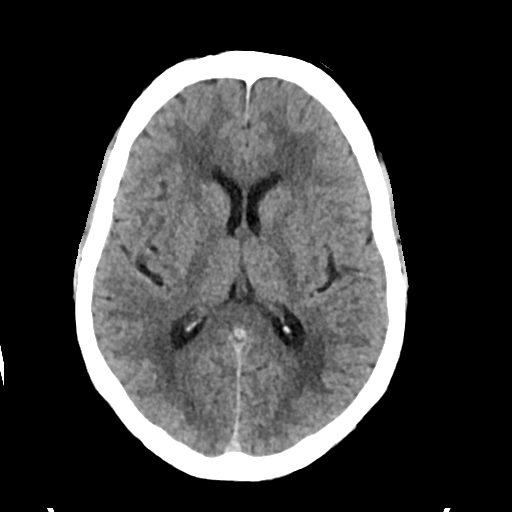
[im 20/32  brain]
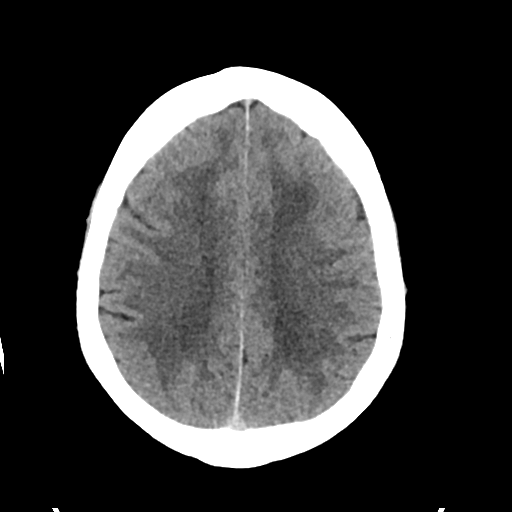
[im 20/32  bone]
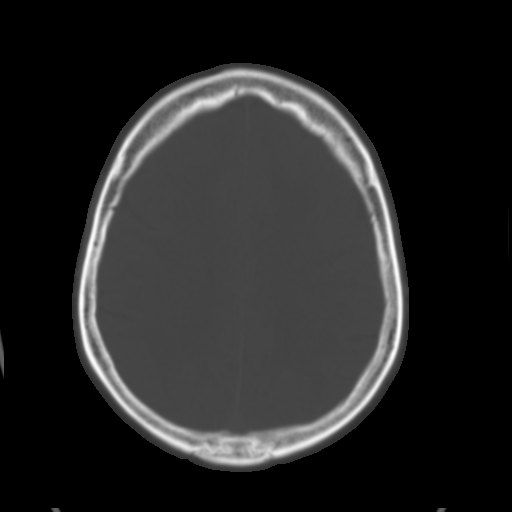
[im 24/32  brain]
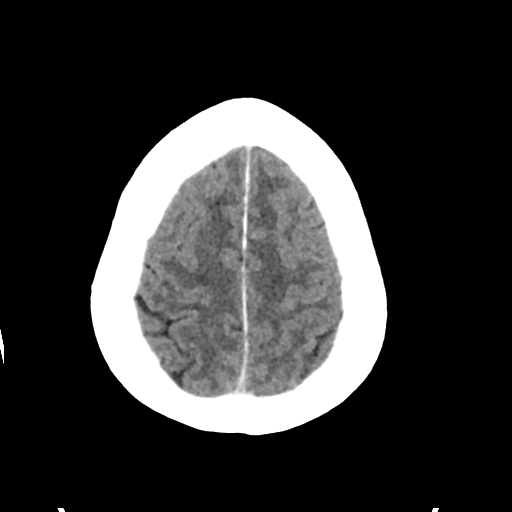
[im 28/32  brain]
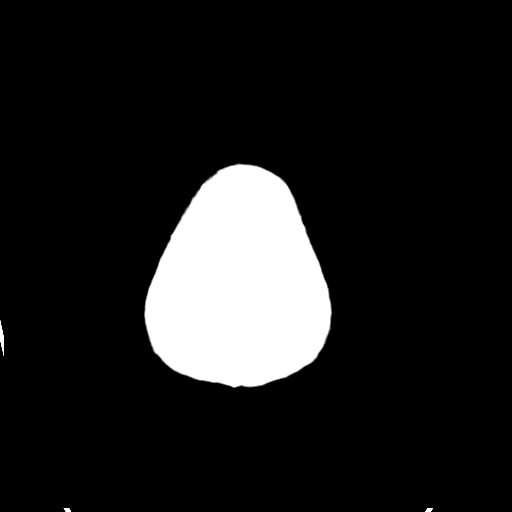

[Series 4: head bone · axial · 0.43mm/px · z∈[-136,-104]mm · 3 of 79 slices shown]
[im 8/79  bone]
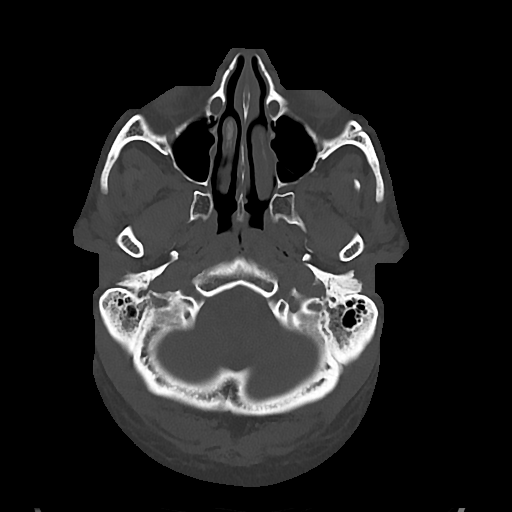
[im 16/79  bone]
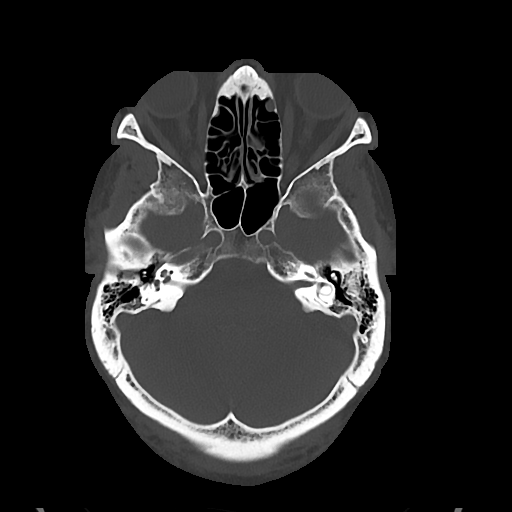
[im 24/79  bone]
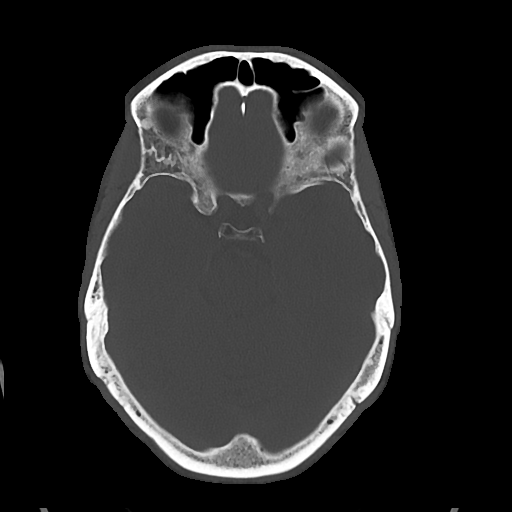

[Series 5: cor soft · coronal · 0.30mm/px · 3 of 69 slices shown]
[im 23/69  brain]
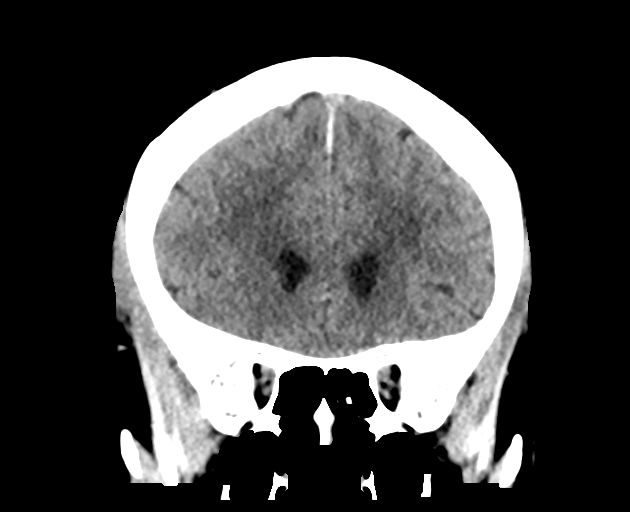
[im 31/69  brain]
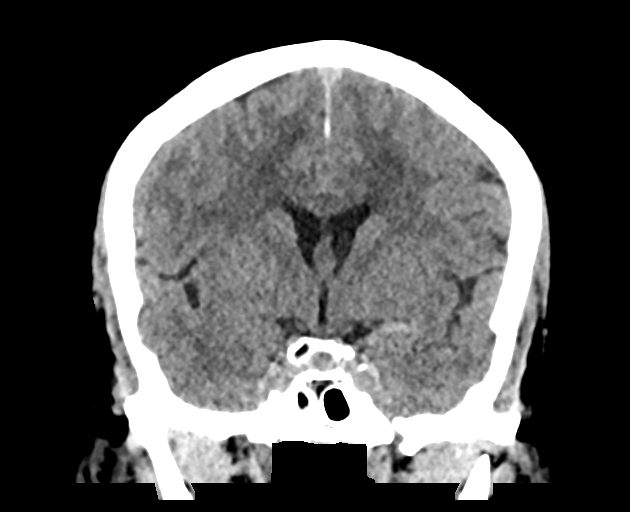
[im 38/69  brain]
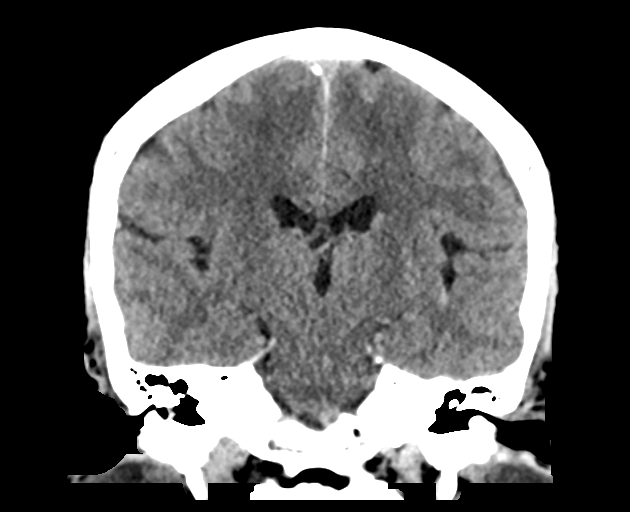

[Series 6: sag soft · sagittal · 0.30mm/px · 3 of 67 slices shown]
[im 23/67  brain]
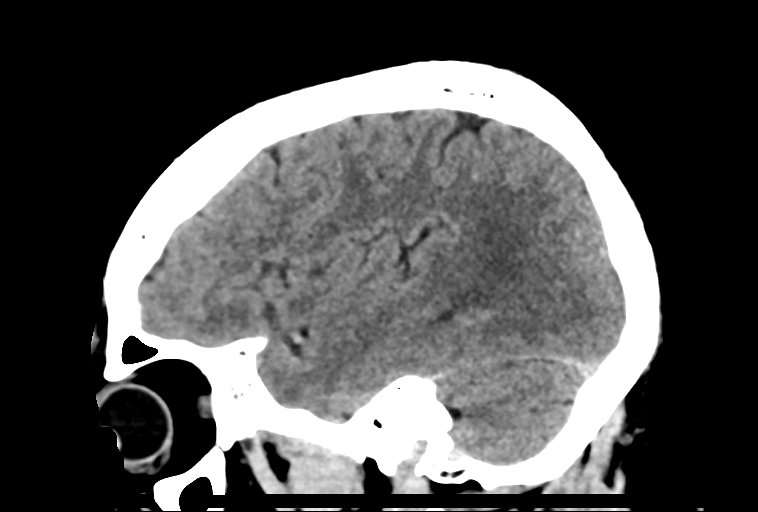
[im 34/67  brain]
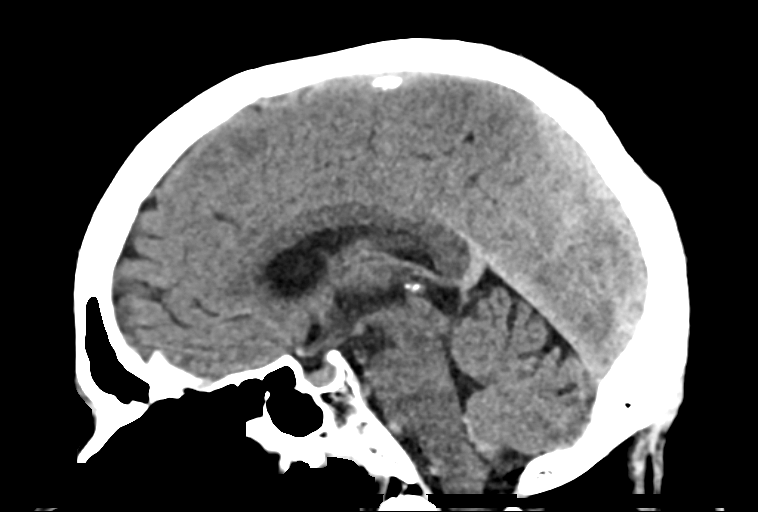
[im 45/67  brain]
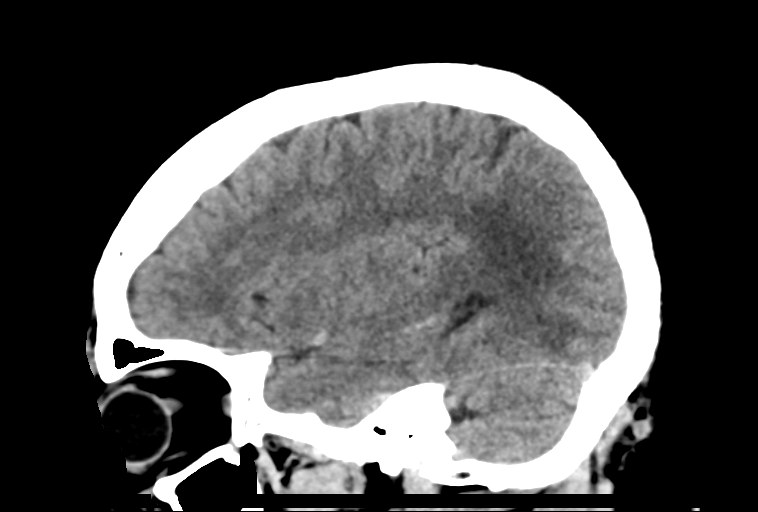

[16 of 47 positions shown; findings below may reference images not displayed]

FINDINGS: Brain: No evidence of acute infarction, hemorrhage, hydrocephalus,
extra-axial collection or mass lesion/mass effect. Periventricular
white matter hypodensity.

Vascular: No hyperdense vessel or unexpected calcification.

Skull: Normal. Negative for fracture or focal lesion.

Sinuses/Orbits: No acute finding.

Other: None.
IMPRESSION: No acute intracranial pathology. No acute non-contrast CT findings
to explain headache. Small-vessel white matter disease.
